# Patient Record
Sex: Male | Born: 1965 | Race: Black or African American | Hispanic: No | Marital: Married | Smoking: Never smoker
Health system: Southern US, Community
[De-identification: ages and names within clinical notes are randomized; demographics above are authoritative.]

## PROBLEM LIST (undated history)

## (undated) DIAGNOSIS — M109 Gout, unspecified: Secondary | ICD-10-CM

## (undated) DIAGNOSIS — I10 Essential (primary) hypertension: Secondary | ICD-10-CM

## (undated) DIAGNOSIS — T8859XA Other complications of anesthesia, initial encounter: Secondary | ICD-10-CM

## (undated) DIAGNOSIS — T4145XA Adverse effect of unspecified anesthetic, initial encounter: Secondary | ICD-10-CM

## (undated) DIAGNOSIS — G4730 Sleep apnea, unspecified: Secondary | ICD-10-CM

## (undated) DIAGNOSIS — I719 Aortic aneurysm of unspecified site, without rupture: Secondary | ICD-10-CM

## (undated) DIAGNOSIS — M1990 Unspecified osteoarthritis, unspecified site: Secondary | ICD-10-CM

## (undated) HISTORY — PX: KNEE ARTHROSCOPY: SHX127

## (undated) HISTORY — PX: EYE SURGERY: SHX253

## (undated) HISTORY — PX: CARPAL TUNNEL RELEASE: SHX101

---

## 2008-02-02 ENCOUNTER — Encounter: Admission: RE | Admit: 2008-02-02 | Discharge: 2008-02-02 | Payer: Self-pay | Admitting: Nephrology

## 2009-01-26 ENCOUNTER — Ambulatory Visit (HOSPITAL_BASED_OUTPATIENT_CLINIC_OR_DEPARTMENT_OTHER): Admission: RE | Admit: 2009-01-26 | Discharge: 2009-01-26 | Payer: Self-pay | Admitting: *Deleted

## 2009-01-29 ENCOUNTER — Ambulatory Visit: Payer: Self-pay | Admitting: Internal Medicine

## 2009-09-05 ENCOUNTER — Encounter: Admission: RE | Admit: 2009-09-05 | Discharge: 2009-09-05 | Payer: Self-pay | Admitting: Cardiology

## 2010-08-23 ENCOUNTER — Inpatient Hospital Stay (INDEPENDENT_AMBULATORY_CARE_PROVIDER_SITE_OTHER)
Admission: RE | Admit: 2010-08-23 | Discharge: 2010-08-23 | Disposition: A | Discharge status: Home / Self Care | Payer: BC Managed Care – PPO | Source: Ambulatory Visit | Attending: Family Medicine | Admitting: Family Medicine

## 2010-08-23 DIAGNOSIS — G5600 Carpal tunnel syndrome, unspecified upper limb: Secondary | ICD-10-CM

## 2010-10-16 ENCOUNTER — Encounter (HOSPITAL_BASED_OUTPATIENT_CLINIC_OR_DEPARTMENT_OTHER)
Admission: RE | Admit: 2010-10-16 | Discharge: 2010-10-16 | Disposition: A | Discharge status: Home / Self Care | Payer: BC Managed Care – PPO | Source: Ambulatory Visit | Attending: Orthopedic Surgery | Admitting: Orthopedic Surgery

## 2010-10-16 DIAGNOSIS — Z01810 Encounter for preprocedural cardiovascular examination: Secondary | ICD-10-CM | POA: Insufficient documentation

## 2010-10-16 DIAGNOSIS — Z01812 Encounter for preprocedural laboratory examination: Secondary | ICD-10-CM | POA: Insufficient documentation

## 2010-10-16 DIAGNOSIS — Z01818 Encounter for other preprocedural examination: Secondary | ICD-10-CM | POA: Insufficient documentation

## 2010-10-16 LAB — BASIC METABOLIC PANEL
BUN: 11 mg/dL (ref 6–23)
CO2: 29 mEq/L (ref 19–32)
Chloride: 107 mEq/L (ref 96–112)
GFR calc Af Amer: >60 mL/min (ref >60)
GFR calc non Af Amer: 56 mL/min — ABNORMAL LOW (ref >60)
Potassium: 4.6 mEq/L (ref 3.5–5.1)
Sodium: 141 mEq/L (ref 135–145)

## 2010-10-19 ENCOUNTER — Ambulatory Visit (HOSPITAL_BASED_OUTPATIENT_CLINIC_OR_DEPARTMENT_OTHER)
Admission: RE | Admit: 2010-10-19 | Discharge: 2010-10-19 | Disposition: A | Discharge status: Home / Self Care | Payer: BC Managed Care – PPO | Source: Ambulatory Visit | Attending: Orthopedic Surgery | Admitting: Orthopedic Surgery

## 2010-10-19 DIAGNOSIS — Z01812 Encounter for preprocedural laboratory examination: Secondary | ICD-10-CM | POA: Insufficient documentation

## 2010-10-19 DIAGNOSIS — I10 Essential (primary) hypertension: Secondary | ICD-10-CM | POA: Insufficient documentation

## 2010-10-19 DIAGNOSIS — G4733 Obstructive sleep apnea (adult) (pediatric): Secondary | ICD-10-CM | POA: Insufficient documentation

## 2010-10-19 DIAGNOSIS — G5600 Carpal tunnel syndrome, unspecified upper limb: Secondary | ICD-10-CM | POA: Insufficient documentation

## 2010-10-19 DIAGNOSIS — Z01810 Encounter for preprocedural cardiovascular examination: Secondary | ICD-10-CM | POA: Insufficient documentation

## 2010-10-24 NOTE — Op Note (Signed)
David [name], [name] NO.:  [id number]  MEDICAL RECORD NO.:  [id number]          PATIENT TYPE:  LOCATION:                                 FACILITY:  PHYSICIAN:  David [name], M.D. DATE OF BIRTH:  11-Nov-1965  DATE OF PROCEDURE:  10/19/2010 DATE OF DISCHARGE:                              OPERATIVE REPORT   PREOPERATIVE DIAGNOSIS:  Moderately severe right carpal tunnel syndrome.  POSTOPERATIVE DIAGNOSIS:  Moderately severe right carpal tunnel syndrome.  OPERATIONS:  Release of right transverse carpal ligament.  OPERATING SURGEON:  David Fitch. Sharvil Hoey, MD  ASSISTANT:  David Reeks Dasnoit, PA  ANESTHESIA:  General anesthesia by LMA, ultimately converted to endotracheal technique due to airway challenge prior to initiation of surgery, supervising anesthesiologist is E. 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  He has a history of bilateral hand numbness, right greater than left. He presented for evaluation at the [facility] and was seen by David [name] who noted evidence of bilateral carpal tunnel syndrome.  He returned to the care of his primary care physician, David [name], and was referred for an upper extremity orthopedic consult with [facility].  Clinical examination revealed signs of bilateral carpal tunnel syndrome. Electrodiagnostic study is completed by David [name], revealed significant bilateral carpal tunnel syndrome.  We advised David [name] to undergo staged bilateral carpal tunnel release.  After informed consent, he was brought to the operating room at this time.  DESCRIPTION OF PROCEDURE:  David [name] was brought to room #1 of [facility] and placed in the supine position on the  operating table.  Following an Anesthesia consult with David [name], general anesthesia by LMA technique was recommended and accepted.  Under Dr. Marlane [name] direct supervision, general anesthesia by LMA technique was induced.  As David [name] arm was being prepped, he had an episode of coughing and had a transient period of laryngeal spasm.  Dr. Jairo [name], attending anesthesiologist, immediately evaluated David [name] and recommended proceeding with succinylcholine paralysis, followed by intubation.  This was easily accomplished with a glide scope.  Thereafter, David [name] was stabilized and the surgical team proceeded with a prep for right carpal tunnel surgery.  The right arm was exsanguinated with Esmarch bandage and the arterial tourniquet on the proximal brachium inflated to 250 mmHg.  A short incision was fashioned in the line of the ring finger in the palm.  The subcutaneous tissues were carefully divided, revealing the palmar fascia.  These were followed back to the median nerve proper which was gently isolated from the transcarpal ligament with the aid of a [place] 4 Engineer, structural.  The transverse carpal ligament was isolated and defined subcutaneously followed by release of the ligament with scissors extending into the distal forearm.  This widely opened the carpal canal.  No mass or predicaments were noted. Bleeding points along the margin of the released ligament were electrocauterized with  bipolar current.  The  wound was then repaired with intradermal 3-0 Prolene suture.  A compressive dressing was applied with a volar plaster splint, maintaining the wrist in 5 degrees of dorsiflexion.  For aftercare, David [name] is provided prescription for Vicodin 7.5 mg 1 p.o. q.4-6 hours p.r.n. pain, 20 tablets without refill.  We will see him back followup in our office in 8 days for dressing change, probable suture removal and advancement to an exercise  program.     David Fitch. David [name], M.D.     RVS/MEDQ  D:  10/19/2010  T:  10/20/2010  Job:  [id number]  cc:   David Slade, MD  Electronically Signed by David [name] M.D. on 10/24/2010 12:03:14 PM

## 2010-11-09 ENCOUNTER — Ambulatory Visit (HOSPITAL_BASED_OUTPATIENT_CLINIC_OR_DEPARTMENT_OTHER)
Admission: RE | Admit: 2010-11-09 | Payer: BC Managed Care – PPO | Source: Ambulatory Visit | Admitting: Orthopedic Surgery

## 2010-11-14 NOTE — Procedures (Signed)
David [name], David [name] NO.:  [id number]   MEDICAL RECORD NO.:  [id number]          PATIENT TYPE:  OUT   LOCATION:  SLEEP CENTER                 FACILITY:  [facility]   PHYSICIAN:  Clinton D. Maple Hudson, MD, FCCP, FACPDATE OF BIRTH:  Jan 13, 1966   DATE OF STUDY:  01/26/2009                            NOCTURNAL POLYSOMNOGRAM   REFERRING PHYSICIAN:  Rosine Abe, M.D.   INDICATION FOR STUDY:  Hypersomnia with sleep apnea.   EPWORTH SLEEPINESS SCORE:  Epworth sleepiness score 4/24, BMI 47.3.  Weight 293 pounds, height 66 inches.  Neck 20 inches.   MEDICATIONS:  Home medications charted and reviewed.   SLEEP ARCHITECTURE:  Total sleep time 205 minutes with sleep efficiency  56.2%.  Stage I was 9.8%, stage II 81%, stage III absent, REM 9.3% of  total sleep time.  Sleep latency 50.5 minutes.  REM latency 99.5  minutes.  Awake after sleep onset 34.5 minutes.  Arousal index 28.4.  No  bedtime medication taken.   RESPIRATORY DATA:  Apnea/hypopnea index (AHI) 6.1 per hour.  A total of  21 events were scored as hypopneas with most events while supine.  REM  AHI 34.7 per hour.  An additional 10 marginal events not meeting  duration and intensity criteria as apneas or hypopneas, were scored as  respiratory effort related arousals for a cumulative respiratory  disturbance index (RDI) of 9.1 per hour.  There were insufficient events  to permit CPAP titration by split protocol on the study night.   OXYGEN DATA:  Loud snoring with oxygen desaturation to a nadir of 86%.  Mean oxygen saturation through the study was 94.2% on room air.   CARDIAC DATA:  Normal sinus rhythm.   MOVEMENT-PARASOMNIA:  No movement disturbance.  Bathroom x1.   IMPRESSIONS-RECOMMENDATIONS:  1. Mild obstructive sleep apnea/hypopnea syndrome, AHI 6.1 per hour,      RDI 9.1 per hour.  Loud snoring with oxygen desaturation to a nadir      of 86% on room air.  2. There were insufficient events to permit CPAP  titration by protocol      on the study night.  If conservative      measures such as weight loss, side sleeping and treatment for nasal      congestion are insufficient, then return for CPAP titration might      be considered, if clinically appropriate.      Clinton D. Maple Hudson, MD, [facility], FACP  Diplomate, Biomedical engineer of Sleep Medicine  Electronically Signed     CDY/MEDQ  D:  01/30/2009 11:01:02  T:  01/30/2009 12:21:43  Job:  [id number]

## 2016-06-06 ENCOUNTER — Ambulatory Visit (INDEPENDENT_AMBULATORY_CARE_PROVIDER_SITE_OTHER): Payer: 59 | Admitting: Orthopaedic Surgery

## 2016-06-06 ENCOUNTER — Encounter (INDEPENDENT_AMBULATORY_CARE_PROVIDER_SITE_OTHER): Payer: Self-pay | Admitting: Orthopaedic Surgery

## 2016-06-06 VITALS — BP 118/82 | HR 81 | Resp 14 | Ht 69.0 in | Wt 296.0 lb

## 2016-06-06 DIAGNOSIS — G8929 Other chronic pain: Secondary | ICD-10-CM

## 2016-06-06 DIAGNOSIS — M25562 Pain in left knee: Secondary | ICD-10-CM | POA: Diagnosis not present

## 2016-06-06 NOTE — Progress Notes (Signed)
   Office Visit Note   Patient: David PippinFrankie E Muradyan           Date of Birth: 04-17-1966           MRN: [id number] Visit Date: 06/06/2016              Requested by: No referring provider defined for this encounter. PCP: No primary care provider on file.   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   Plan: We'll plan on scheduling arthroscopic evaluation of left knee with probable lateral release. He preferred to have this sometime in mid January. We had a long discussion regarding his problem and what might be achieved with an arthroscopic debridement. He is fully aware that this may or may not make a big difference but I think it's the next best step. We've talked about time out of work.  Follow-Up Instructions: No Follow-up on file.   Orders:  No orders of the defined types were placed in this encounter.  No orders of the defined types were placed in this encounter.     Procedures: No procedures performed   Clinical Data: No additional findings.   Subjective: No chief complaint on file.   Multi aspiration, [facility] with cortisone injections last time 2 weeks ago.  Xrays on canopy  Tylenol arthritis, ice , heat  [name] has had problems with his left knee for well over 2 years. He denies any injury or trauma. 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 Been followed by his primary care physician with for recurrent effusion and cortisone injections. He is frustrated because he has recurrent pain and swelling. He's had prior x-rays performed recently demonstrating mild degenerative changes and a small joint effusion of his left knee. An MRI scan was performed in August 2016 demonstrated mild irregularity of the hyalin cartilage along the lateral patella facet and lateral subluxation of the patella both menisci were intact.  Review of Systems   Objective: Vital Signs:  There were no vitals taken for this visit.  Physical Exam  Ortho Exam left knee exam demonstrates minimal effusion. There was lateral patellar pressure with some popping and clicking. I thought the patella was well positioned. There was some patella crepitation with flexion. No evidence of instability. No medial lateral joint pain. Full extension and flexion comparable to the right knee. No swelling distally neurovascular exam was intact. Negative anterior drawer sign  Specialty Comments:  No specialty comments available.  Imaging: No results found.   PMFS History: There are no active problems to display for this patient.  No past medical history on file.  No family history on file.  No past surgical history on file. Social History   Occupational History  . Not on file.   Social History Main Topics  . Smoking status: Not on file  . Smokeless tobacco: Not on file  . Alcohol use Not on file  . Drug use: Unknown  . Sexual activity: Not on file

## 2016-08-01 ENCOUNTER — Inpatient Hospital Stay (INDEPENDENT_AMBULATORY_CARE_PROVIDER_SITE_OTHER): Payer: 59 | Admitting: Orthopaedic Surgery

## 2016-08-09 ENCOUNTER — Encounter: Payer: Self-pay | Admitting: Orthopaedic Surgery

## 2016-08-09 DIAGNOSIS — M222X2 Patellofemoral disorders, left knee: Secondary | ICD-10-CM | POA: Diagnosis not present

## 2016-08-15 ENCOUNTER — Ambulatory Visit (INDEPENDENT_AMBULATORY_CARE_PROVIDER_SITE_OTHER): Payer: 59 | Admitting: Orthopaedic Surgery

## 2016-08-15 DIAGNOSIS — G8929 Other chronic pain: Secondary | ICD-10-CM

## 2016-08-15 DIAGNOSIS — M25562 Pain in left knee: Secondary | ICD-10-CM

## 2016-08-15 NOTE — Progress Notes (Signed)
   Office Visit Note   Patient: David [name]           Date of Birth: 09-03-1965           MRN: [id number] Visit Date: 08/15/2016              Requested by: No referring provider defined for this encounter. PCP: No primary care provider on file.   Assessment & Plan: Visit Diagnoses: [name] is 6 days status post left knee arthroscopy with arthroscopic lateral release and debridement of articular cartilage. He removed his knee immobilizer several days ago and now is unable to extend the knee is not able to extend the knee.  Plan: I attempted an aspiration but there really was nothing in the joint. Like his wife to work on getting his knee straight and then reapply  the knee immobilizer and see him back in a week at that point he may start some physical therapy.  Follow-Up Instructions: No Follow-up on file.   Orders:  No orders of the defined types were placed in this encounter.  No orders of the defined types were placed in this encounter.     Procedures: No procedures performed   Clinical Data: No additional findings.   Subjective: Chief Complaint  Patient presents with  . Left Knee - Routine Post Op    08/09/16    Patient here for post op follow up left knee scope, complains of pain and swelling. States left foot also is swollen.   Patient remove the knee immobilizer several days ago and the dressing. He was instructed on keeping that the the dressing and knee immobilizer placed. Nonetheless he now has a problem extending his knee. He's had some swelling in his lower extremity but denies any calf or thigh pain. Also denies any fever or chills.  Review of Systems   Objective: Vital Signs: There were no vitals taken for this visit.  Physical Exam  Ortho Exam the left knee was warm compared to the right knee. I could not tell if he had a significant effusion as he was as knee was fixed in about 90 of flexion. He did have some pitting edema diffusely on the area  between his knee and his ankle. He has some swelling of his foot. Did not have any posterior thigh or calf pain. The incisions are healing nicely.  I did attempt an aspiration but was unable to retrieve any significant fluid that he also was not significantly tender to touch anywhere about the knee so I don't think that he has a cellulitis or infection.  Specialty Comments:  No specialty comments available.  Imaging: No results found.   PMFS History: There are no active problems to display for this patient.  No past medical history on file.  No family history on file.  No past surgical history on file. Social History   Occupational History  . Not on file.   Social History Main Topics  . Smoking status: Never Smoker  . Smokeless tobacco: Never Used  . Alcohol use No  . Drug use: No  . Sexual activity: Yes

## 2016-08-29 ENCOUNTER — Ambulatory Visit (INDEPENDENT_AMBULATORY_CARE_PROVIDER_SITE_OTHER): Payer: 59 | Admitting: Orthopaedic Surgery

## 2016-08-29 ENCOUNTER — Encounter (INDEPENDENT_AMBULATORY_CARE_PROVIDER_SITE_OTHER): Payer: Self-pay | Admitting: Orthopaedic Surgery

## 2016-08-29 VITALS — BP 140/81 | HR 94 | Resp 14 | Ht 67.0 in | Wt 297.0 lb

## 2016-08-29 DIAGNOSIS — G8929 Other chronic pain: Secondary | ICD-10-CM

## 2016-08-29 DIAGNOSIS — M25562 Pain in left knee: Secondary | ICD-10-CM

## 2016-08-29 NOTE — Progress Notes (Signed)
   Office Visit Note   Patient: David [name]           Date of Birth: 11/02/1965           MRN: [id number] Visit Date: 08/29/2016              Requested by: No referring provider defined for this encounter. PCP: No primary care provider on file.   Assessment & Plan: Visit Diagnoses: 3 weeks status post left knee arthroscopy with debridement of patella and lateral release.   Plan: Continue out of work, continue with physical therapy. Ambulate with crutches until able to extend knee. Office 2 weeks.  Follow-Up Instructions: No Follow-up on file.   Orders:  No orders of the defined types were placed in this encounter.  No orders of the defined types were placed in this encounter.     Procedures: No procedures performed   Clinical Data: No additional findings.   Subjective: No chief complaint on file.    David [name] is 3 weeks status post left knee arthroscopy with arthroscopic lateral release and debridement of articular cartilage. Pt was having so much pain he went to [facility] a weeks ago, no aspiration but did test positive for gout. Pt received an anjection on 08/21/16 by PCP and helped with the pain.Pt is still on crutches/non weight bearing  and is going to PT.  Pt also sent to vascular lab for blood clot-negative   David. David [name] apparently  has had prior history of "gout attacks". This certainly could be related to the onset of pain and loss of motion in his left knee after surgery. Primary care physician injected "some medicine" into his right buttock which "helped". He also had a Doppler study for a DVT which was negative. Review of Systems   Objective: Vital Signs: There were no vitals taken for this visit.  Physical Exam  Ortho Exam left knee exam with minimal effusion. No local tenderness along the medial lateral joint or even along the lateral parapatellar region. Small effusion. No calf pain. No swelling distally. Lacks approximately 50 to knee extension  which is certainly an improvement with physical therapy.. Able to flex over 100.  Specialty Comments:  No specialty comments available.  Imaging: No results found.   PMFS History: There are no active problems to display for this patient.  No past medical history on file.  No family history on file.  No past surgical history on file. Social History   Occupational History  . Not on file.   Social History Main Topics  . Smoking status: Never Smoker  . Smokeless tobacco: Never Used  . Alcohol use No  . Drug use: No  . Sexual activity: Yes

## 2016-09-12 ENCOUNTER — Encounter (INDEPENDENT_AMBULATORY_CARE_PROVIDER_SITE_OTHER): Payer: Self-pay | Admitting: Orthopaedic Surgery

## 2016-09-12 ENCOUNTER — Ambulatory Visit (INDEPENDENT_AMBULATORY_CARE_PROVIDER_SITE_OTHER): Payer: 59 | Admitting: Orthopaedic Surgery

## 2016-09-12 VITALS — BP 180/75 | HR 85 | Ht 64.0 in | Wt 297.0 lb

## 2016-09-12 DIAGNOSIS — G8929 Other chronic pain: Secondary | ICD-10-CM | POA: Diagnosis not present

## 2016-09-12 DIAGNOSIS — M25562 Pain in left knee: Secondary | ICD-10-CM

## 2016-09-12 MED ORDER — BUPIVACAINE HCL 0.5 % IJ SOLN
3.0000 mL | INTRAMUSCULAR | Status: AC | PRN
  Administered 2016-09-12: 3 mL via INTRA_ARTICULAR

## 2016-09-12 MED ORDER — LIDOCAINE HCL 1 % IJ SOLN
5.0000 mL | INTRAMUSCULAR | Status: AC | PRN
  Administered 2016-09-12: 5 mL

## 2016-09-12 MED ORDER — METHYLPREDNISOLONE ACETATE 40 MG/ML IJ SUSP
80.0000 mg | INTRAMUSCULAR | Status: AC | PRN
  Administered 2016-09-12: 80 mg

## 2016-09-12 NOTE — Progress Notes (Signed)
   Office Visit Note   Patient: David [name]           Date of Birth: 05/06/1966           MRN: [id number] Visit Date: 09/12/2016              Requested by: No referring provider defined for this encounter. PCP: No PCP Per Patient   Assessment & Plan: Visit Diagnoses: 5 weeks s/p left knee arthroscopy with lateral release-still unable to extend knee from 90 degrees of flexion  Plan: cortisone inj,manipulation in 1 week if no improvement  Follow-Up Instructions: No Follow-up on file.   Orders:  No orders of the defined types were placed in this encounter.  No orders of the defined types were placed in this encounter.     Procedures: Large Joint Inj Date/Time: 09/12/2016 2:01 PM Performed by: Valeria BatmanWHITFIELD, PETER W Authorized by: Valeria BatmanWHITFIELD, PETER W   Consent Given by:  Patient Timeout: prior to procedure the correct patient, procedure, and site was verified   Indications:  Pain and joint swelling Location:  Knee Site:  L knee Prep: patient was prepped and draped in usual sterile fashion   Needle Size:  25 G Needle Length:  1.5 inches Approach:  Anteromedial Ultrasound Guidance: No   Fluoroscopic Guidance: No   Arthrogram: No   Medications:  5 mL lidocaine 1 %; 80 mg methylPREDNISolone acetate 40 MG/ML; 3 mL bupivacaine 0.5 % Aspiration Attempted: No   Patient tolerance:  Patient tolerated the procedure well with no immediate complications     Clinical Data: No additional findings.   Subjective: No chief complaint on file.   Mr. David [name] is status post 5 weeks left knee meniscus surgery. Edema and pain are constant. Pt is very upset that he still cannot walk without crutches and he has to go back to work.. PT only 2 times as he relates when they twist and turn it makes his knee hurt more and keep swelling. 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  Despite PT still unable to extend left knee beyond 90 flexion  Review of  Systems   Objective: Vital Signs: There were no vitals taken for this visit.  Physical Exam  Ortho Examminimal pain about left knee with minimal effusion. No calf pain. Cannot extend knee from 90 degrees. No evidence of infection or DVT  Specialty Comments:  No specialty comments available.  Imaging: No results found.   PMFS History: There are no active problems to display for this patient.  No past medical history on file.  No family history on file.  No past surgical history on file. Social History   Occupational History  . Not on file.   Social History Main Topics  . Smoking status: Never Smoker  . Smokeless tobacco: Never Used  . Alcohol use No  . Drug use: No  . Sexual activity: Yes

## 2016-09-17 NOTE — Progress Notes (Signed)
Left message with instructions for patient to arrive at 0950. NPO past midnight including gum and candy, will need driver, and cannot stay alone for 24 past anesthesia.

## 2016-09-17 NOTE — H&P (Signed)
    David CampbellPeter Whitfield, MD   David CodeBrian Oreatha Fabry, PA-C [address], [place], [place]  [id number]                             [phone]   ORTHOPAEDIC HISTORY & PHYSICAL  David PippinFrankie E [name] MRN:  [id number] DOB/SEX:  1966-05-29/male  CHIEF COMPLAINT:  Loss of motion left knee   HISTORY:David [name] is status post 5 weeks left knee meniscus surgery. Edema and pain are constant. Pt is very upset that he still cannot walk without crutches and he has to go back to work.. PT only 2 times as he relates when they twist and turn it makes his knee hurt more and keep swelling. 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 Despite PT still unable to extend left knee beyond 90 flexion   PAST MEDICAL HISTORY: There are no active problems to display for this patient.  No past medical history on file. No past surgical history on file.   MEDICATIONS:   Prescriptions Prior to Admission  Medication Sig Dispense Refill Last Dose  . aspirin EC 81 MG tablet Take 81 mg by mouth daily.      . chlorthalidone (HYGROTON) 25 MG tablet Take 25 mg by mouth daily.      Marland Kitchen. lisinopril (PRINIVIL,ZESTRIL) 40 MG tablet Take 40 mg by mouth daily.        ALLERGIES:   Allergies  Allergen Reactions  . Other Anaphylaxis    All Beans    FAMILY HISTORY:  No family history on file.  SOCIAL HISTORY:   Social History  Substance Use Topics  . Smoking status: Never Smoker  . Smokeless tobacco: Never Used  . Alcohol use No      EXAMINATION: Vital signs in last 24 hours: Temp:  [98.9 F (37.2 C)] 98.9 F (37.2 C) (03/20 0936) Pulse Rate:  [72] 72 (03/20 0936) Resp:  [16] 16 (03/20 0936) BP: (117)/(78) 117/78 (03/20 0936) SpO2:  [99 %] 99 % (03/20 0936) Weight:  [297 lb (134.7 kg)] 297 lb (134.7 kg) (03/20 0936)  Head is normocephalic.   Eyes:  Pupils equal, round and reactive to light and accommodation.  Extraocular intact. ENT: Ears, nose, and throat were benign.   Neck: supple, no  bruits were noted.   Chest: good expansion.   Lungs: essentially clear.   Cardiac: regular rhythm and rate, normal S1, S2.  No murmurs appreciated. Pulses :  2+ bilateral and symmetric in lower extremities. Abdomen is scaphoid, soft, nontender, no masses palpable, normal bowel sounds present. CNS:  He is oriented x3 and cranial nerves II-XII grossly intact.minimal pain about left knee with minimal effusion. No calf pain. Cannot extend knee from 90 degrees. No evidence of infection or DVT Breast, rectal, and genital exams: not performed and not indicated for an orthopedic evaluation. Musculoskeletal: unable to extend knee past 90 degrees of flexion.    ASSESSMENT: left knee post op arthrofibrosis  No past medical history on file.  PLAN: Plan for left knee manipulation  The procedure,  risks, and benefits of surgery were presented and reviewed including possible recurrence of arthrofibrosis. The patient acknowledged the explanation, agreed to proceed.   David DroneBrian D. Santiago Bumpersetrarca, PA-C David [name] [phone]  David DroneBrian D. Aleda Granaetrarca, PA-C David [name] [phone]  09/18/2016 9:50 AM

## 2016-09-18 ENCOUNTER — Ambulatory Visit (HOSPITAL_COMMUNITY): Payer: 59 | Admitting: Certified Registered Nurse Anesthetist

## 2016-09-18 ENCOUNTER — Encounter (HOSPITAL_COMMUNITY)
Admission: RE | Disposition: A | Discharge status: Home / Self Care | Payer: Self-pay | Source: Ambulatory Visit | Attending: Orthopaedic Surgery

## 2016-09-18 ENCOUNTER — Encounter (HOSPITAL_COMMUNITY): Payer: Self-pay | Admitting: *Deleted

## 2016-09-18 ENCOUNTER — Ambulatory Visit (HOSPITAL_COMMUNITY)
Admission: RE | Admit: 2016-09-18 | Discharge: 2016-09-18 | Disposition: A | Discharge status: Home / Self Care | Payer: 59 | Source: Ambulatory Visit | Attending: Orthopaedic Surgery | Admitting: Orthopaedic Surgery

## 2016-09-18 DIAGNOSIS — M24662 Ankylosis, left knee: Secondary | ICD-10-CM | POA: Diagnosis not present

## 2016-09-18 DIAGNOSIS — Y838 Other surgical procedures as the cause of abnormal reaction of the patient, or of later complication, without mention of misadventure at the time of the procedure: Secondary | ICD-10-CM | POA: Insufficient documentation

## 2016-09-18 DIAGNOSIS — Z91018 Allergy to other foods: Secondary | ICD-10-CM | POA: Insufficient documentation

## 2016-09-18 DIAGNOSIS — Z7982 Long term (current) use of aspirin: Secondary | ICD-10-CM | POA: Diagnosis not present

## 2016-09-18 DIAGNOSIS — I10 Essential (primary) hypertension: Secondary | ICD-10-CM | POA: Diagnosis not present

## 2016-09-18 DIAGNOSIS — M9689 Other intraoperative and postprocedural complications and disorders of the musculoskeletal system: Secondary | ICD-10-CM | POA: Insufficient documentation

## 2016-09-18 DIAGNOSIS — Z79899 Other long term (current) drug therapy: Secondary | ICD-10-CM | POA: Insufficient documentation

## 2016-09-18 DIAGNOSIS — Z6841 Body Mass Index (BMI) 40.0 and over, adult: Secondary | ICD-10-CM | POA: Insufficient documentation

## 2016-09-18 DIAGNOSIS — M76892 Other specified enthesopathies of left lower limb, excluding foot: Secondary | ICD-10-CM | POA: Diagnosis not present

## 2016-09-18 DIAGNOSIS — M7500 Adhesive capsulitis of unspecified shoulder: Secondary | ICD-10-CM | POA: Diagnosis present

## 2016-09-18 HISTORY — DX: Essential (primary) hypertension: I10

## 2016-09-18 HISTORY — DX: Sleep apnea, unspecified: G47.30

## 2016-09-18 HISTORY — DX: Aortic aneurysm of unspecified site, without rupture: I71.9

## 2016-09-18 HISTORY — DX: Adverse effect of unspecified anesthetic, initial encounter: T41.45XA

## 2016-09-18 HISTORY — DX: Other complications of anesthesia, initial encounter: T88.59XA

## 2016-09-18 HISTORY — PX: KNEE CLOSED REDUCTION: SHX995

## 2016-09-18 LAB — POCT I-STAT 4, (NA,K, GLUC, HGB,HCT)
Glucose, Bld: 101 mg/dL — ABNORMAL HIGH (ref 65–99)
HCT: 40 % (ref 39.0–52.0)
Hemoglobin: 13.6 g/dL (ref 13.0–17.0)
POTASSIUM: 3.5 mmol/L (ref 3.5–5.1)
SODIUM: 142 mmol/L (ref 135–145)

## 2016-09-18 SURGERY — MANIPULATION, KNEE, CLOSED
Anesthesia: General | Site: Knee | Laterality: Left

## 2016-09-18 MED ORDER — OXYCODONE-ACETAMINOPHEN 5-325 MG PO TABS
ORAL_TABLET | ORAL | Status: AC
  Administered 2016-09-18: 2 via ORAL
  Filled 2016-09-18: qty 2

## 2016-09-18 MED ORDER — METHOCARBAMOL 500 MG PO TABS
500.0000 mg | ORAL_TABLET | Freq: Once | ORAL | Status: AC
  Administered 2016-09-18: 500 mg via ORAL

## 2016-09-18 MED ORDER — METHOCARBAMOL 500 MG PO TABS: 500.0000 mg | ORAL_TABLET | Freq: Four times a day (QID) | ORAL | 0 refills | Status: DC | PRN

## 2016-09-18 MED ORDER — HYDROMORPHONE HCL 1 MG/ML IJ SOLN
INTRAMUSCULAR | Status: AC
  Administered 2016-09-18: 0.5 mg via INTRAVENOUS
  Filled 2016-09-18: qty 0.5

## 2016-09-18 MED ORDER — SUGAMMADEX SODIUM 200 MG/2ML IV SOLN
INTRAVENOUS | Status: AC
  Filled 2016-09-18: qty 2

## 2016-09-18 MED ORDER — SODIUM CHLORIDE 0.9 % IV SOLN: INTRAVENOUS | Status: DC

## 2016-09-18 MED ORDER — DEXAMETHASONE SODIUM PHOSPHATE 10 MG/ML IJ SOLN
INTRAMUSCULAR | Status: AC
  Filled 2016-09-18: qty 1

## 2016-09-18 MED ORDER — FENTANYL CITRATE (PF) 100 MCG/2ML IJ SOLN
INTRAMUSCULAR | Status: AC
  Filled 2016-09-18: qty 2

## 2016-09-18 MED ORDER — PROPOFOL 10 MG/ML IV BOLUS
INTRAVENOUS | Status: AC
  Filled 2016-09-18: qty 40

## 2016-09-18 MED ORDER — EPHEDRINE 5 MG/ML INJ
INTRAVENOUS | Status: AC
  Filled 2016-09-18: qty 10

## 2016-09-18 MED ORDER — LACTATED RINGERS IV SOLN
INTRAVENOUS | Status: DC | PRN
  Administered 2016-09-18: 10:00:00 via INTRAVENOUS

## 2016-09-18 MED ORDER — LIDOCAINE 2% (20 MG/ML) 5 ML SYRINGE
INTRAMUSCULAR | Status: DC | PRN
  Administered 2016-09-18: 60 mg via INTRAVENOUS

## 2016-09-18 MED ORDER — PHENYLEPHRINE 40 MCG/ML (10ML) SYRINGE FOR IV PUSH (FOR BLOOD PRESSURE SUPPORT)
PREFILLED_SYRINGE | INTRAVENOUS | Status: AC
  Filled 2016-09-18: qty 10

## 2016-09-18 MED ORDER — METHOCARBAMOL 500 MG PO TABS
ORAL_TABLET | ORAL | Status: AC
  Administered 2016-09-18: 500 mg via ORAL
  Filled 2016-09-18: qty 1

## 2016-09-18 MED ORDER — PROPOFOL 10 MG/ML IV BOLUS
INTRAVENOUS | Status: DC | PRN
  Administered 2016-09-18: 200 mg via INTRAVENOUS

## 2016-09-18 MED ORDER — OXYCODONE-ACETAMINOPHEN 5-325 MG PO TABS: 1.0000 | ORAL_TABLET | ORAL | 0 refills | Status: DC | PRN

## 2016-09-18 MED ORDER — LIDOCAINE 2% (20 MG/ML) 5 ML SYRINGE
INTRAMUSCULAR | Status: AC
  Filled 2016-09-18: qty 5

## 2016-09-18 MED ORDER — MIDAZOLAM HCL 2 MG/2ML IJ SOLN
INTRAMUSCULAR | Status: AC
  Filled 2016-09-18: qty 2

## 2016-09-18 MED ORDER — SUCCINYLCHOLINE CHLORIDE 200 MG/10ML IV SOSY
PREFILLED_SYRINGE | INTRAVENOUS | Status: AC
  Filled 2016-09-18: qty 10

## 2016-09-18 MED ORDER — LIDOCAINE-EPINEPHRINE (PF) 1 %-1:200000 IJ SOLN
INTRAMUSCULAR | Status: DC | PRN
  Administered 2016-09-18: 10 mL

## 2016-09-18 MED ORDER — ACETAMINOPHEN 10 MG/ML IV SOLN
1000.0000 mg | Freq: Once | INTRAVENOUS | Status: AC
  Administered 2016-09-18: 1000 mg via INTRAVENOUS
  Filled 2016-09-18: qty 100

## 2016-09-18 MED ORDER — OXYCODONE-ACETAMINOPHEN 5-325 MG PO TABS
2.0000 | ORAL_TABLET | Freq: Once | ORAL | Status: AC
  Administered 2016-09-18: 2 via ORAL

## 2016-09-18 MED ORDER — HYDROMORPHONE HCL 1 MG/ML IJ SOLN
0.2500 mg | INTRAMUSCULAR | Status: DC | PRN
  Administered 2016-09-18: 0.5 mg via INTRAVENOUS

## 2016-09-18 MED ORDER — ONDANSETRON HCL 4 MG/2ML IJ SOLN
INTRAMUSCULAR | Status: AC
  Filled 2016-09-18: qty 2

## 2016-09-18 MED ORDER — PROMETHAZINE HCL 25 MG/ML IJ SOLN: 6.2500 mg | INTRAMUSCULAR | Status: DC | PRN

## 2016-09-18 MED ORDER — LIDOCAINE-EPINEPHRINE (PF) 1 %-1:200000 IJ SOLN
INTRAMUSCULAR | Status: AC
  Filled 2016-09-18: qty 30

## 2016-09-18 MED ORDER — MIDAZOLAM HCL 2 MG/2ML IJ SOLN
INTRAMUSCULAR | Status: DC | PRN
  Administered 2016-09-18: 2 mg via INTRAVENOUS

## 2016-09-18 MED ORDER — BUPIVACAINE-EPINEPHRINE (PF) 0.5% -1:200000 IJ SOLN
INTRAMUSCULAR | Status: DC | PRN
  Administered 2016-09-18: 30 mL via PERINEURAL

## 2016-09-18 MED ORDER — FENTANYL CITRATE (PF) 100 MCG/2ML IJ SOLN
INTRAMUSCULAR | Status: DC | PRN
  Administered 2016-09-18: 100 ug via INTRAVENOUS

## 2016-09-18 MED ORDER — ROCURONIUM BROMIDE 50 MG/5ML IV SOSY
PREFILLED_SYRINGE | INTRAVENOUS | Status: AC
  Filled 2016-09-18: qty 5

## 2016-09-18 SURGICAL SUPPLY — 7 items
GAUZE SPONGE 4X4 12PLY STRL (GAUZE/BANDAGES/DRESSINGS) ×2 IMPLANT
IMMOBILIZER KNEE 24 THIGH 36 (MISCELLANEOUS) ×1 IMPLANT
IMMOBILIZER KNEE 24 UNIV (MISCELLANEOUS) ×2
NEEDLE 18GX1X1/2 (RX/OR ONLY) (NEEDLE) ×2 IMPLANT
NEEDLE 22X1 1/2 (OR ONLY) (NEEDLE) ×2 IMPLANT
SOLUTION BETADINE 4OZ (MISCELLANEOUS) ×2 IMPLANT
SYR CONTROL 10ML LL (SYRINGE) ×2 IMPLANT

## 2016-09-18 NOTE — Transfer of Care (Signed)
Immediate Anesthesia Transfer of Care Note  Patient: David PippinFrankie E [name]  Procedure(s) Performed: Procedure(s): LEFT CLOSED MANIPULATION KNEE (Left)  Patient Location: PACU  Anesthesia Type:General  Level of Consciousness: awake, alert  and oriented  Airway & Oxygen Therapy: Patient Spontanous Breathing  Post-op Assessment: Report given to RN, Post -op Vital signs reviewed and stable and Patient moving all extremities  Post vital signs: Reviewed and stable  Last Vitals:  Vitals:   09/18/16 1047 09/18/16 1049  BP: 126/78   Pulse: 82 80  Resp: 20 18  Temp:      Last Pain:  Vitals:   09/18/16 1049  TempSrc:   PainSc: 6       Patients Stated Pain Goal: 3 (09/18/16 1000)  Complications: No apparent anesthesia complications

## 2016-09-18 NOTE — Anesthesia Postprocedure Evaluation (Addendum)
Anesthesia Post Note  Patient: David [name]  Procedure(s) Performed: Procedure(s) (LRB): LEFT CLOSED MANIPULATION KNEE (Left)  Patient location during evaluation: PACU Anesthesia Type: General Level of consciousness: sedated Pain management: pain level controlled Vital Signs Assessment: post-procedure vital signs reviewed and stable Respiratory status: spontaneous breathing and respiratory function stable Cardiovascular status: stable Anesthetic complications: no       Last Vitals:  Vitals:   09/18/16 1119 09/18/16 1131  BP: 112/76 114/90  Pulse: 72 79  Resp: 18 18  Temp: 36.7 C     Last Pain:  Vitals:   09/18/16 1131  TempSrc:   PainSc: 3                  Truda Staub DANIEL

## 2016-09-18 NOTE — Op Note (Signed)
PATIENT ID:      David PippinFrankie E [name]  MRN:     [id number] DOB/AGE:    Mar 25, 1966 / 51 y.o.       OPERATIVE REPORT    DATE OF PROCEDURE:  09/18/2016       PREOPERATIVE DIAGNOSIS:   LEFT KNEE ADHESIVE CAPSULITIS                                                       Estimated body mass index is 50.98 kg/m as calculated from the following:   Height as of 09/12/16: 5\' 4"  (1.626 m).   Weight as of this encounter: 297 lb (134.7 kg).     POSTOPERATIVE DIAGNOSIS:   LEFT KNEE ADHESIVE CAPSULITIS                                                                     Estimated body mass index is 50.98 kg/m as calculated from the following:   Height as of 09/12/16: 5\' 4"  (1.626 m).   Weight as of this encounter: 297 lb (134.7 kg).     PROCEDURE:  Procedure(s): LEFT CLOSED MANIPULATION KNEE      SURGEON:  Norlene CampbellPeter Deanette Tullius, MD    ASSISTANT:   Jacqualine CodeBrian Petrarca, PA-C   (Present and scrubbed throughout the case, critical for assistance with exposure, retraction, instrumentation, and closure.)          ANESTHESIA: regional and general     DRAINS: none :      TOURNIQUET TIME: * No tourniquets in log *    COMPLICATIONS:  None   CONDITION:  stable  PROCEDURE IN [id number]  David MangesPeter W Aldrich Lloyd 09/18/2016, 10:39 AM

## 2016-09-18 NOTE — Addendum Note (Signed)
Addendum  created 09/18/16 1242 by Jodell CiproSarah A Chelby Salata, CRNA   Charge Capture section accepted

## 2016-09-18 NOTE — Op Note (Signed)
NAME:  David [name], David [name]                   ACCOUNT NO.:  MEDICAL RECORD NO.:  [id number]  LOCATION:                                 FACILITY:  PHYSICIAN:  Peter W. Whitfield, M.D.    DATE OF BIRTH:  DATE OF PROCEDURE:  09/18/2016 DATE OF DISCHARGE:                              OPERATIVE REPORT   PREOPERATIVE DIAGNOSIS:  Adhesive capsulitis, left knee.  POSTOPERATIVE DIAGNOSIS:  Adhesive capsulitis, left knee.  PROCEDURE:  Closed manipulation, left knee.  SURGEON:  Peter W. Whitfield, M.D.  ASSISTANT:  Brian D. Petrarca, P.A.-C.  ANESTHESIA:  General with adductor canal block.  COMPLICATIONS:  None.  HISTORY:  This 50-year-old gentleman is approximately 6 weeks status post left knee arthroscopy with a lateral release.  He has developed a fixed flexion contracture of his knee despite physical therapy.  He has had a recent cortisone injection that allowed him to flex, to extend within 45 degrees of full extension, but continues to have difficulty with ambulation and pain.  He is now to have a closed manipulation of his knee.  DESCRIPTION OF PROCEDURE:  [name] was met with his wife in the holding area, identified the left knee as appropriate operative site and marked it accordingly.  Anesthesia performed an adductor canal block. The patient was then transported to room #3, and on the operating stretcher, he was placed under general laryngal anesthesia.  At that point, the knee was gently manipulated in both flexion and extension. There was obvious lysis of adhesions to the point where we could flex his knee over 125 degrees and extended to neutral.  We then injected 8 mL of 1% Xylocaine with epinephrine.  Prepped the knee with alcohol and Betadine, injected the knee without difficulty.  A knee immobilizer was applied.  The patient was then returned to the postanesthesia recovery room without complications.     Peter W. Whitfield, M.D.    PWW/MEDQ  D:  09/18/2016   T:  09/18/2016  Job:  [id number] 

## 2016-09-18 NOTE — H&P (Signed)
The recent History & Physical has been reviewed. I have personally examined the patient today. There is no interval change to the documented History & Physical. The patient would like to proceed with the procedure.  Valeria Batmaneter W Kerron Sedano 09/18/2016,  9:39 AM

## 2016-09-18 NOTE — Anesthesia Preprocedure Evaluation (Addendum)
Anesthesia Evaluation  Patient identified by MRN, date of birth, ID band Patient awake    Reviewed: Allergy & Precautions, NPO status , Patient's Chart, lab work & pertinent test results  History of Anesthesia Complications Negative for: history of anesthetic complications  Airway Mallampati: III  TM Distance: >3 FB Neck ROM: Full    Dental no notable dental hx. (+) Dental Advisory Given   Pulmonary neg pulmonary ROS,    Pulmonary exam normal        Cardiovascular hypertension, Pt. on medications Normal cardiovascular exam     Neuro/Psych negative neurological ROS  negative psych ROS   GI/Hepatic negative GI ROS, Neg liver ROS,   Endo/Other  Morbid obesity  Renal/GU negative Renal ROS     Musculoskeletal   Abdominal   Peds  Hematology   Anesthesia Other Findings   Reproductive/Obstetrics                            Anesthesia Physical Anesthesia Plan  ASA: III  Anesthesia Plan: General   Post-op Pain Management: GA combined w/ Regional for post-op pain   Induction: Intravenous  Airway Management Planned: Oral ETT and LMA  Additional Equipment:   Intra-op Plan:   Post-operative Plan: Extubation in OR  Informed Consent: I have reviewed the patients History and Physical, chart, labs and discussed the procedure including the risks, benefits and alternatives for the proposed anesthesia with the patient or authorized representative who has indicated his/her understanding and acceptance.   Dental advisory given  Plan Discussed with: CRNA, Anesthesiologist and Surgeon  Anesthesia Plan Comments:       Anesthesia Quick Evaluation

## 2016-09-18 NOTE — Anesthesia Procedure Notes (Signed)
Anesthesia Regional Block: Adductor canal block   Pre-Anesthetic Checklist: ,, timeout performed, Correct Patient, Correct Site, Correct Laterality, Correct Procedure, Correct Position, site marked, Risks and benefits discussed,  Surgical consent,  Pre-op evaluation,  At surgeon's request and post-op pain management  Laterality: Left  Prep: chloraprep       Needles:  Injection technique: Single-shot  Needle Type: Stimulator Needle - 80     Needle Length: 10cm  Needle Gauge: 21     Additional Needles:   Procedures: ultrasound guided,,,,,,,,  Narrative:  Start time: 09/18/2016 10:07 AM End time: 09/18/2016 10:17 AM Injection made incrementally with aspirations every 5 mL.  Performed by: Personally

## 2016-09-18 NOTE — Anesthesia Procedure Notes (Signed)
Procedure Name: LMA Insertion Date/Time: 09/18/2016 10:31 AM Performed by: Orvilla FusATO, Annessa Satre A Pre-anesthesia Checklist: Patient identified, Emergency Drugs available, Suction available and Patient being monitored Patient Re-evaluated:Patient Re-evaluated prior to inductionOxygen Delivery Method: Circle System Utilized Preoxygenation: Pre-oxygenation with 100% oxygen Intubation Type: IV induction LMA: LMA inserted LMA Size: 5.0 Number of attempts: 1 Placement Confirmation: positive ETCO2 Tube secured with: Tape Dental Injury: Teeth and Oropharynx as per pre-operative assessment

## 2016-09-19 ENCOUNTER — Encounter (HOSPITAL_COMMUNITY): Payer: Self-pay | Admitting: Orthopaedic Surgery

## 2016-09-26 ENCOUNTER — Ambulatory Visit (INDEPENDENT_AMBULATORY_CARE_PROVIDER_SITE_OTHER): Payer: 59

## 2016-09-26 ENCOUNTER — Ambulatory Visit (INDEPENDENT_AMBULATORY_CARE_PROVIDER_SITE_OTHER): Payer: 59 | Admitting: Orthopaedic Surgery

## 2016-09-26 VITALS — Ht 60.0 in

## 2016-09-26 DIAGNOSIS — M25562 Pain in left knee: Secondary | ICD-10-CM | POA: Diagnosis not present

## 2016-09-26 DIAGNOSIS — G8929 Other chronic pain: Secondary | ICD-10-CM | POA: Diagnosis not present

## 2016-09-26 MED ORDER — LIDOCAINE HCL 1 % IJ SOLN
5.0000 mL | INTRAMUSCULAR | Status: AC | PRN
  Administered 2016-09-26: 5 mL

## 2016-09-26 MED ORDER — BUPIVACAINE HCL 0.5 % IJ SOLN
3.0000 mL | INTRAMUSCULAR | Status: AC | PRN
  Administered 2016-09-26: 3 mL via INTRA_ARTICULAR

## 2016-09-26 MED ORDER — METHYLPREDNISOLONE ACETATE 40 MG/ML IJ SUSP
80.0000 mg | INTRAMUSCULAR | Status: AC | PRN
  Administered 2016-09-26: 80 mg

## 2016-09-26 NOTE — Progress Notes (Signed)
Office Visit Note   Patient: David [name]           Date of Birth: 03/01/1966           MRN: [id number] Visit Date: 09/26/2016              Requested by: No referring provider defined for this encounter. PCP: David [name]   Assessment & Plan: Visit Diagnoses: adhesive capsulitis left knee s/p arthroscopy-one week s/p manipulation. Still having some discomfort and inability to fully extend the knee Plan: I aspirated of the left knee of about 6 mL of somewhat bloody fluid I will. I will send this for cell cell count crystals. He does have history of gout. Also injected's 5 mL of 1% Xylocaine without epinephrine and 80 mg of Depo-Medrol. I'll set up physical therapy and obtain aJahss splint.to Help regain extension of the left knee. Continue out of work. office 1 week.  Follow-Up Instructions: No Follow-up on file.   Orders:  Orders Placed This Encounter  Procedures  . XR Pelvis 1-2 Views   No orders of the defined types were placed in this encounter.     Procedures: Large Joint Inj Date/Time: 09/26/2016 11:27 AM Performed by: David BatmanWHITFIELD, David [name] W Authorized by: David BatmanWHITFIELD, David [name] W   Consent Given by:  Patient Timeout: prior to procedure the correct patient, procedure, and site was verified   Indications:  Pain and joint swelling Location:  Knee Site:  L knee Prep: patient was prepped and draped in usual sterile fashion   Needle Size:  25 G Needle Length:  1.5 inches Approach:  Anteromedial Ultrasound Guidance: No   Fluoroscopic Guidance: No   Arthrogram: No   Medications:  5 mL lidocaine 1 %; 80 mg methylPREDNISolone acetate 40 MG/ML; 3 mL bupivacaine 0.5 % Aspiration Attempted: No   Patient tolerance:  Patient tolerated the procedure well with no immediate complications     Clinical Data: No additional findings.   Subjective: Chief Complaint  Patient presents with  . Left Knee - Routine Post Op    Mr. David [name] is a 51 year old male status post 6 days  sugery. He ambulate son crutches.  [name] developed adhesive capsulitis left knee after knee arthroscopy and lateral release. He is one week s/p closed manipulation with a knee immobilizer.Using crutches and still having some pain. No fever or chills. Having some left hip pain  Review of Systems   Objective: Vital Signs: Ht 5' (1.524 m)   Physical Exam  Ortho Exam left knee was perfectly straight after manipulation last week. A knee immobilizer was applied. The knee immobilizer is barely covering his knee and now in a  distal position. His knee is flexed about 30 and I'm having difficulty extending it there is a small effusion but the knee wasn't particularly warm hot or uncomfortable. He seems to have some pain with range of motion of his left hip.Pain localized about the left knee. No instability. The knee was not hot warm or red. Has developed some early quad atrophy.  Specialty Comments:  No specialty comments available.  Imaging: Xr Pelvis 1-2 Views  Result Date: 09/26/2016 X-ray of the pelvis is obtained and 1 AP projection. There are degenerative changes about both hips with peripheral osteophytes, calcification within the capsule and mild decrease in the joint space. No evidence of a fracture or other bony abnormality.    PMFS History: Patient Active Problem List   Diagnosis Date Noted  . Arthrofibrosis of  knee joint, left 09/18/2016   Past Medical History:  Diagnosis Date  . Aneurysm of aorta (HCC)   . Complication of anesthesia    hard to wake up  . Hypertension   . Sleep apnea     No family history on file.  Past Surgical History:  Procedure Laterality Date  . CARPAL TUNNEL RELEASE Right   . EYE SURGERY     d/t cornea laceration  . KNEE ARTHROSCOPY Left   . KNEE CLOSED REDUCTION Left 09/18/2016   Procedure: LEFT CLOSED MANIPULATION KNEE;  Surgeon: David Batman, [name];  Location: [facility] OR;  Service: Orthopedics;  Laterality: Left;   Social History    Occupational History  . Not on file.   Social History Main Topics  . Smoking status: Never Smoker  . Smokeless tobacco: Never Used  . Alcohol use No  . Drug use: No  . Sexual activity: Yes

## 2016-09-27 ENCOUNTER — Telehealth (INDEPENDENT_AMBULATORY_CARE_PROVIDER_SITE_OTHER): Payer: Self-pay | Admitting: Orthopaedic Surgery

## 2016-09-27 NOTE — Telephone Encounter (Signed)
[employer] rep calling in ref to the L knee.  Form was sent and by fax yesterday. Patient is need immediately, but notes and medical necessity form needs completion.  This needs to be faxed back today.  Patient is scheduled to be fitted tomorrow, but will not be able to without info.

## 2016-09-27 NOTE — Telephone Encounter (Signed)
Left VM for rep to send me the fax number as the [facility] order was [facility] yesterday

## 2016-10-03 ENCOUNTER — Encounter (INDEPENDENT_AMBULATORY_CARE_PROVIDER_SITE_OTHER): Payer: Self-pay | Admitting: Orthopaedic Surgery

## 2016-10-03 ENCOUNTER — Ambulatory Visit (INDEPENDENT_AMBULATORY_CARE_PROVIDER_SITE_OTHER): Payer: 59 | Admitting: Orthopaedic Surgery

## 2016-10-03 VITALS — BP 125/71 | HR 75 | Resp 16 | Ht 70.0 in | Wt 297.0 lb

## 2016-10-03 DIAGNOSIS — G8929 Other chronic pain: Secondary | ICD-10-CM

## 2016-10-03 DIAGNOSIS — M25562 Pain in left knee: Secondary | ICD-10-CM

## 2016-10-03 NOTE — Progress Notes (Signed)
   Office Visit Note   Patient: David [name]           Date of Birth: 1966/01/28           MRN: [id number] Visit Date: 10/03/2016              Requested by: Estanislado Pandy, MD [address] [place], [place] [address] PCP: Estanislado Pandy, MD   Assessment & Plan: Visit Diagnoses:  1. Chronic pain of left knee   Status post left knee arthroscopy and lateral release development of adhesive capsulitis. Status post knee manipulation time several weeks. Range of motion, swelling and pain improved  Plan: continue with PT and Jahss splint left knee. No work  Follow-Up Instructions: Return in about 2 weeks (around 10/17/2016).   Orders:  No orders of the defined types were placed in this encounter.  No orders of the defined types were placed in this encounter.     Procedures: No procedures performed   Clinical Data: No additional findings.   Subjective: Chief Complaint  Patient presents with  . Left Knee - Routine Post Op    HPI David [name] is status post manipulation of his left knee for adhesive capsulitis. He received the Jahss splint last Friday and feels that it's making a difference. He is able to bear more weight in his left lower extremity. Pain is not significant. Review of Systems   Objective: Vital Signs: BP 125/71   Pulse 75   Resp 16   Ht  (1.778 m)   Wt 297 lb (134.7 kg)   BMI 42.62 kg/m   Physical Exam  Ortho Exam left knee exam without evidence of effusion. No localized areas of tenderness. No instability. Flexed well over 105. Lacked approximately 20 to full extension would certainly is an improvement.  Specialty Comments:  No specialty comments available.  Imaging: No results found.   PMFS History: Patient Active Problem List   Diagnosis Date Noted  . Arthrofibrosis of knee joint, left 09/18/2016   Past Medical History:  Diagnosis Date  . Aneurysm of aorta (HCC)   . Complication of anesthesia    hard to wake up  . Hypertension   .  Sleep apnea     History reviewed. No pertinent family history.  Past Surgical History:  Procedure Laterality Date  . CARPAL TUNNEL RELEASE Right   . EYE SURGERY     d/t cornea laceration  . KNEE ARTHROSCOPY Left   . KNEE CLOSED REDUCTION Left 09/18/2016   Procedure: LEFT CLOSED MANIPULATION KNEE;  Surgeon: Valeria Batman, MD;  Location: [facility] OR;  Service: Orthopedics;  Laterality: Left;   Social History   Occupational History  . Not on file.   Social History Main Topics  . Smoking status: Never Smoker  . Smokeless tobacco: Never Used  . Alcohol use No  . Drug use: No  . Sexual activity: Yes

## 2016-10-04 ENCOUNTER — Encounter: Payer: Self-pay | Admitting: *Deleted

## 2016-10-05 ENCOUNTER — Ambulatory Visit (INDEPENDENT_AMBULATORY_CARE_PROVIDER_SITE_OTHER): Payer: 59 | Admitting: Cardiology

## 2016-10-05 ENCOUNTER — Telehealth: Payer: Self-pay | Admitting: Cardiology

## 2016-10-05 ENCOUNTER — Encounter: Payer: Self-pay | Admitting: Cardiology

## 2016-10-05 VITALS — BP 112/78 | HR 90 | Ht 66.0 in | Wt 299.6 lb

## 2016-10-05 DIAGNOSIS — I10 Essential (primary) hypertension: Secondary | ICD-10-CM

## 2016-10-05 DIAGNOSIS — I712 Thoracic aortic aneurysm, without rupture, unspecified: Secondary | ICD-10-CM

## 2016-10-05 MED ORDER — METOPROLOL SUCCINATE ER 25 MG PO TB24: 12.5000 mg | ORAL_TABLET | Freq: Every day | ORAL | 3 refills | Status: DC

## 2016-10-05 MED ORDER — LISINOPRIL 20 MG PO TABS: 20.0000 mg | ORAL_TABLET | Freq: Every day | ORAL | 3 refills | Status: DC

## 2016-10-05 NOTE — Patient Instructions (Signed)
Your physician wants you to follow-up in: 1 YEAR WITH DR [facility] You will receive a reminder letter in the mail two months in advance. If you don't receive a letter, please call our office to schedule the follow-up appointment.  Your physician has recommended you make the following change in your medication:   START TOPROL XL 12.5 MG DAILY   DECREASE LISINOPRIL 20 MG DAILY  Your physician recommends that you return for lab work PRIOR TO TEST BMP  Non-Cardiac CT Angiography (CTA), is a special type of CT scan that uses a computer to produce multi-dimensional views of major blood vessels throughout the body. In CT angiography, a contrast material is injected through an IV to help visualize the blood vessels  Thank you for choosing [facility]!!

## 2016-10-05 NOTE — Telephone Encounter (Signed)
CT Scan April 19th, 2018 David [name]

## 2016-10-05 NOTE — Progress Notes (Signed)
Clinical Summary Mr. Ortner is a 51 y.o.male seen as new patient, former patient of Dr Molly Maduro. He is referred by Dr Neita Carp  1. Thoracic aortic aneurysm - CT scan 2011 4.5 cm ascending aneurysm according to prior clinic notes.  - f/u echo meausred at 4.7 cm.  - he has not started metoprolol yet    2. HTN - compliant with meds  Past Medical History:  Diagnosis Date  . Aneurysm of aorta (HCC)   . Complication of anesthesia    hard to wake up  . Hypertension   . Sleep apnea      Allergies  Allergen Reactions  . Other Anaphylaxis    All Beans     Current Outpatient Prescriptions  Medication Sig Dispense Refill  . acetaminophen (TYLENOL) 325 MG tablet Take 650 mg by mouth every 6 (six) hours as needed.    Marland Kitchen aspirin EC 81 MG tablet Take 81 mg by mouth daily.     . chlorthalidone (HYGROTON) 25 MG tablet Take 25 mg by mouth daily.     Marland Kitchen lisinopril (PRINIVIL,ZESTRIL) 40 MG tablet Take 40 mg by mouth daily.     . methocarbamol (ROBAXIN) 500 MG tablet Take 1 tablet (500 mg total) by mouth every 6 (six) hours as needed for muscle spasms. (Patient not taking: Reported on 10/03/2016) 30 tablet 0  . metoprolol succinate (TOPROL-XL) 50 MG 24 hr tablet Take 50 mg by mouth daily. Take with or immediately following a meal.    . Multiple Vitamin (MULTIVITAMIN) tablet Take 1 tablet by mouth daily.    Marland Kitchen oxyCODONE-acetaminophen (ROXICET) 5-325 MG tablet Take 1-2 tablets by mouth every 4 (four) hours as needed for severe pain. (Patient not taking: Reported on 10/03/2016) 50 tablet 0   No current facility-administered medications for this visit.      Past Surgical History:  Procedure Laterality Date  . CARPAL TUNNEL RELEASE Right   . EYE SURGERY     d/t cornea laceration  . KNEE ARTHROSCOPY Left   . KNEE CLOSED REDUCTION Left 09/18/2016   Procedure: LEFT CLOSED MANIPULATION KNEE;  Surgeon: Valeria Batman, MD;  Location: [facility] OR;  Service: Orthopedics;  Laterality: Left;      Allergies  Allergen Reactions  . Other Anaphylaxis    All Beans      No family history on file.   Social History Mr. Klees reports that he has never smoked. He has never used smokeless tobacco. Mr. Magallon reports that he does not drink alcohol.   Review of Systems CONSTITUTIONAL: No weight loss, fever, chills, weakness or fatigue.  HEENT: Eyes: No visual loss, blurred vision, double vision or yellow sclerae.No hearing loss, sneezing, congestion, runny nose or sore throat.  SKIN: No rash or itching.  CARDIOVASCULAR: no chest pain RESPIRATORY: No shortness of breath, cough or sputum.  GASTROINTESTINAL: No anorexia, nausea, vomiting or diarrhea. No abdominal pain or blood.  GENITOURINARY: No burning on urination, no polyuria NEUROLOGICAL: No headache, dizziness, syncope, paralysis, ataxia, numbness or tingling in the extremities. No change in bowel or bladder control.  MUSCULOSKELETAL: No muscle, back pain, joint pain or stiffness.  LYMPHATICS: No enlarged nodes. No history of splenectomy.  PSYCHIATRIC: No history of depression or anxiety.  ENDOCRINOLOGIC: No reports of sweating, cold or heat intolerance. No polyuria or polydipsia.  Marland Kitchen   Physical Examination Vitals:   10/05/16 1016 10/05/16 1021  BP: 109/75 112/78  Pulse: 90 90   Vitals:   10/05/16 1016  Weight:  299 lb 9.6 oz (135.9 kg)  Height: 5\' 6"  (1.676 m)    Gen: resting comfortably, no acute distress HEENT: no scleral icterus, pupils equal round and reactive, no palptable cervical adenopathy,  CV: RRR, no m/r/g, no jvd Resp: Clear to auscultation bilaterally GI: abdomen is soft, non-tender, non-distended, normal bowel sounds, no hepatosplenomegaly MSK: extremities are warm, no edema.  Skin: warm, no rash Neuro:  no focal deficits Psych: appropriate affect     Assessment and Plan   1. Aortic aneurysm - he will start beta blocker, Toprol XL 12.5mg  daily.  - we will repeat CTA  2. HTN - start  beta blocker in setting of anerusym, lower lisionpril to <BADT XTTAG>  daily to allow room with bp       Antoine Poche, M.D.

## 2016-10-09 ENCOUNTER — Telehealth: Payer: Self-pay | Admitting: Cardiology

## 2016-10-09 NOTE — Telephone Encounter (Signed)
Wanting to do his lab work at [facility].   Wants lab work sent there and call letting him know they have been sent

## 2016-10-09 NOTE — Telephone Encounter (Signed)
Informed patient that he can take lab order with him to primary that was given day of his office visit.  Patient verbalized understanding.

## 2016-10-17 ENCOUNTER — Ambulatory Visit (INDEPENDENT_AMBULATORY_CARE_PROVIDER_SITE_OTHER): Payer: 59 | Admitting: Orthopaedic Surgery

## 2016-10-17 ENCOUNTER — Encounter (INDEPENDENT_AMBULATORY_CARE_PROVIDER_SITE_OTHER): Payer: Self-pay | Admitting: Orthopaedic Surgery

## 2016-10-17 VITALS — BP 118/82 | HR 70 | Ht 64.0 in | Wt 285.0 lb

## 2016-10-17 DIAGNOSIS — M25562 Pain in left knee: Secondary | ICD-10-CM

## 2016-10-17 DIAGNOSIS — G8929 Other chronic pain: Secondary | ICD-10-CM

## 2016-10-17 NOTE — Progress Notes (Signed)
   Office Visit Note   Patient: David [name]           Date of Birth: [birth year]           MRN: [id number] Visit Date: 10/17/2016              Requested by: Lovey Newcomer, PA [address] [place], [place] [address] PCP: Lovey Newcomer, [place]   Assessment & Plan: Visit Diagnoses:  1. Chronic pain of left knee   Range of motion left knee is improving with a combination of exercises and theJAHSS splint.  Plan: Continue with the splint and the exercises. David [name] would like to return to work Monday the 23rd in a full-time capacity. 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 We'll see back in 1 month  Follow-Up Instructions: Return in about 1 month (around 11/16/2016).   Orders:  No orders of the defined types were placed in this encounter.  No orders of the defined types were placed in this encounter.     Procedures: No procedures performed   Clinical Data: No additional findings.   Subjective: Chief Complaint  Patient presents with  . Left Knee - Routine Post Op   David [name] is status post left knee arthroscopy with lateral release. He developed a significant flexion contracture that required closed manipulation. Since that time he's been using crutches and a, splint and physical therapy and actually is having much less pain and increased range of motion. 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 No local tenderness along the patella or the lateral parapatellar region. No instability. No pain to range of motion of either hip. Full relaxation and some downward pressure on the knee I could obtain -16 of extension and flexion is well beyond 105. Vascular exam intact.  Specialty Comments:  No specialty  comments available.  Imaging: No results found.   PMFS History: Patient Active Problem List   Diagnosis Date Noted  . Arthrofibrosis of knee joint, left 09/18/2016   Past Medical History:  Diagnosis Date  . Aneurysm of aorta (HCC)   . Complication of anesthesia    hard to wake up  . Hypertension   . Sleep apnea     No family history on file.  Past Surgical History:  Procedure Laterality Date  . CARPAL TUNNEL RELEASE Right   . EYE SURGERY     d/t cornea laceration  . KNEE ARTHROSCOPY Left   . KNEE CLOSED REDUCTION Left 09/18/2016   Procedure: LEFT CLOSED MANIPULATION KNEE;  Surgeon: Valeria Batman, MD;  Location: [facility] OR;  Service: Orthopedics;  Laterality: Left;   Social History   Occupational History  . Not on file.   Social History Main Topics  . Smoking status: Never Smoker  . Smokeless tobacco: Never Used  . Alcohol use No  . Drug use: No  . Sexual activity: Yes

## 2016-10-18 ENCOUNTER — Ambulatory Visit (HOSPITAL_COMMUNITY): Payer: 59

## 2016-11-06 ENCOUNTER — Telehealth (INDEPENDENT_AMBULATORY_CARE_PROVIDER_SITE_OTHER): Payer: Self-pay

## 2016-11-06 ENCOUNTER — Telehealth (INDEPENDENT_AMBULATORY_CARE_PROVIDER_SITE_OTHER): Payer: Self-pay | Admitting: Orthopaedic Surgery

## 2016-11-06 NOTE — Telephone Encounter (Signed)
Please advise 

## 2016-11-06 NOTE — Telephone Encounter (Signed)
Patient states he is unable to walk without the assistance of crutches since his left knee surgery in January. He is unable to straighten his leg and his leg has been swelling and painful. He has been using ice as a relief effort but has not had any relief. Patient would like to know what he can do for relief or if he needs to be seen in the office. Please advise.

## 2016-11-06 NOTE — Telephone Encounter (Signed)
Come to [facility] tomorrow

## 2016-11-06 NOTE — Telephone Encounter (Signed)
LVMOM for pt to come into [facility] office on Wed.

## 2016-11-06 NOTE — Telephone Encounter (Signed)
LVMOM for pt to come into [facility] office

## 2016-11-07 ENCOUNTER — Ambulatory Visit (INDEPENDENT_AMBULATORY_CARE_PROVIDER_SITE_OTHER): Payer: 59 | Admitting: Orthopaedic Surgery

## 2016-11-07 ENCOUNTER — Encounter (INDEPENDENT_AMBULATORY_CARE_PROVIDER_SITE_OTHER): Payer: Self-pay | Admitting: Orthopaedic Surgery

## 2016-11-07 VITALS — BP 112/75 | HR 86 | Ht 64.0 in | Wt 265.0 lb

## 2016-11-07 DIAGNOSIS — G8929 Other chronic pain: Secondary | ICD-10-CM | POA: Diagnosis not present

## 2016-11-07 DIAGNOSIS — M25562 Pain in left knee: Secondary | ICD-10-CM

## 2016-11-07 MED ORDER — LIDOCAINE HCL 1 % IJ SOLN
5.0000 mL | INTRAMUSCULAR | Status: AC | PRN
  Administered 2016-11-07: 5 mL

## 2016-11-07 MED ORDER — BUPIVACAINE HCL 0.5 % IJ SOLN
3.0000 mL | INTRAMUSCULAR | Status: AC | PRN
  Administered 2016-11-07: 3 mL via INTRA_ARTICULAR

## 2016-11-07 MED ORDER — METHYLPREDNISOLONE ACETATE 40 MG/ML IJ SUSP
80.0000 mg | INTRAMUSCULAR | Status: AC | PRN
  Administered 2016-11-07: 80 mg

## 2016-11-07 NOTE — Progress Notes (Signed)
Office Visit Note   Patient: David [name]           Date of Birth: [birth year]           MRN: [id number] Visit Date: 11/07/2016              Requested by: Lovey NewcomerBoyd, William S, PA [address] [place], [place] [address] PCP: Lovey NewcomerBoyd, William S, [place]   Assessment & Plan: Visit Diagnoses:  1. Chronic pain of left knee   Recurrent thigh spasm postoperatively associated with adhesive capsulitis. Status post manipulation. Recurrent hamstring spasm. I will plan to inject the area hamstring tenderness posterior laterally. After the injection he had better knee extension but with some pain along the anterior lateral knee joint I injected this with Marcaine and Xylocaine and was able to extend the knee to within 30 of full extension. Continue with the Jahss splint, use a muscle relaxant and let me see him next week, keep him out of work this week.  Follow-Up Instructions: Return in about 1 week (around 11/14/2016).   Orders:  No orders of the defined types were placed in this encounter.  No orders of the defined types were placed in this encounter.     Procedures: Large Joint Inj Date/Time: 11/07/2016 1:16 PM Performed by: Valeria BatmanWHITFIELD, Creedon Danielski W Authorized by: Valeria BatmanWHITFIELD, Keileigh Vahey W   Consent Given by:  Patient Timeout: prior to procedure the correct patient, procedure, and site was verified   Indications:  Pain and joint swelling Location:  Knee Site:  L knee Prep: patient was prepped and draped in usual sterile fashion   Needle Size:  25 G Needle Length:  1.5 inches Approach:  Anteromedial Ultrasound Guidance: No   Fluoroscopic Guidance: No   Arthrogram: No   Medications:  5 mL lidocaine 1 %; 80 mg methylPREDNISolone acetate 40 MG/ML; 3 mL bupivacaine 0.5 % Aspiration Attempted: No   Patient tolerance:  Patient tolerated the procedure well with no immediate complications     Clinical Data: No additional findings.   Subjective: Chief Complaint  Patient presents with  . Left Knee -  Follow-up, Pain, Edema    David [name] is a 51 y o that presents with Left knee pain. 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 Sleep apnea  Mr. Varady's had a very difficult time postoperatively with his left knee. He's over 3 months status post arthroscopic lateral release. He had significant cramping and spasm of his hamstring tendons postoperatively that eventually required manipulation of his knee for adhesive capsulitis we then placed him in a JAHSS splint with resultant full extension of his knee. He went back to work 1 or 2 weeks ago was doing very well. He does drive long distances after sitting in his truck for 8 hours at a time he developed recurrent spasm of his hamstrings the point now is using crutches as he can't fully extend his knee. He's had a full evaluation by his primary care physician with lab work and he notes that he is "fine"he does not experience any knee pain  HPI  Review of Systems   Objective: Vital Signs: BP 112/75   Pulse 86   Ht 5\' 4"  (1.626 m)   Wt 265 lb (120.2 kg)   BMI 45.49 kg/m   Physical Exam  Ortho Exam left knee exam with small effusion. The knee was cyst minimally warm compared to the opposite side. No localized areas  of tenderness about the left knee. On examination he keeps his knee flexed 90 with any with difficulty extending knee beyond that point. He was tender along the hamstring tendons laterally. I injected the area of tenderness with Xylocaine and Depo-Medrol at that point, apical pain about 45 to full extension. He was experiencing pain along the anterolateral joint line. I injected this with a combination of Xylocaine and Marcaine of that that point was able to extend his knee to about 30. I was able to evaluate the knee at that point still no tenderness about the patella or in the area of the lateral release. I placed him in a knee immobilizer and he was able to get into his trauma. I've asked that  he apply the extension splint and take muscle relaxants and see what happens over the rest of the week. We'll keep moderate work until Monday. He's had a very unusual reaction in my experience with hamstring spasm especially after he developed full extension and flexion of his knee without any significant pain was able to return to work  Specialty Comments:  No specialty comments available.  Imaging: No results found.   PMFS History: Patient Active Problem List   Diagnosis Date Noted  . Arthrofibrosis of knee joint, left 09/18/2016   Past Medical History:  Diagnosis Date  . Aneurysm of aorta (HCC)   . Complication of anesthesia    hard to wake up  . Hypertension   . Sleep apnea     History reviewed. No pertinent family history.  Past Surgical History:  Procedure Laterality Date  . CARPAL TUNNEL RELEASE Right   . EYE SURGERY     d/t cornea laceration  . KNEE ARTHROSCOPY Left   . KNEE CLOSED REDUCTION Left 09/18/2016   Procedure: LEFT CLOSED MANIPULATION KNEE;  Surgeon: Valeria Batman, MD;  Location: [facility] OR;  Service: Orthopedics;  Laterality: Left;   Social History   Occupational History  . Not on file.   Social History Main Topics  . Smoking status: Never Smoker  . Smokeless tobacco: Never Used  . Alcohol use No  . Drug use: No  . Sexual activity: Yes     Valeria Batman, MD   Note - This record has been created using [employer].  Chart creation errors have been sought, but may not always  have been located. Such creation errors do not reflect on  the standard of medical care.

## 2016-11-14 ENCOUNTER — Ambulatory Visit (INDEPENDENT_AMBULATORY_CARE_PROVIDER_SITE_OTHER): Payer: 59 | Admitting: Orthopaedic Surgery

## 2016-11-15 ENCOUNTER — Encounter: Payer: Self-pay | Admitting: *Deleted

## 2016-12-01 NOTE — Addendum Note (Signed)
Addendum  created 12/01/16 1051 by Wilkes Potvin, MD   Sign clinical note    

## 2017-10-17 ENCOUNTER — Encounter: Payer: Self-pay | Admitting: Internal Medicine

## 2017-11-27 ENCOUNTER — Ambulatory Visit (INDEPENDENT_AMBULATORY_CARE_PROVIDER_SITE_OTHER): Payer: Self-pay

## 2017-11-27 DIAGNOSIS — Z1211 Encounter for screening for malignant neoplasm of colon: Secondary | ICD-10-CM

## 2017-11-27 NOTE — Progress Notes (Signed)
Gastroenterology Pre-Procedure Review  Request Date:11/27/17 Requesting Physician: Dr.Howard- [facility] ( no previous tcs)   PATIENT REVIEW QUESTIONS: The patient responded to the following health history questions as indicated:    1. Diabetes Melitis: no 2. Joint replacements in the past 12 months: no 3. Major health problems in the past 3 months: no 4. Has an artificial valve or MVP: no 5. Has a defibrillator: no 6. Has been advised in past to take antibiotics in advance of a procedure like teeth cleaning: no 7. Family history of colon cancer: no  8. Alcohol Use: no 9. History of sleep apnea: yes (on cpap)  10. History of coronary artery or other vascular stents placed within the last 12 months: no 11. History of any prior anesthesia complications: yes (hard time waking up, he said it took 4 hours to wake up)    MEDICATIONS & ALLERGIES:    Patient reports the following regarding taking any blood thinners:   Plavix? no Aspirin? yes ( ) Coumadin? no Brilinta? no Xarelto? no Eliquis? no Pradaxa? no Savaysa? no Effient? no  Patient confirms/reports the following medications:  Current Outpatient Medications  Medication Sig Dispense Refill  . aspirin EC 81 MG tablet Take 81 mg by mouth daily.     . chlorthalidone (HYGROTON) 25 MG tablet Take 25 mg by mouth daily.     . indomethacin (INDOCIN) 50 MG capsule indomethacin 50 mg capsule  Take 1 capsule every 8 hours by oral route as needed for 30 days.    Marland Kitchen lisinopril (PRINIVIL,ZESTRIL) 40 MG tablet Take 40 mg by mouth daily.  0  . metoprolol succinate (TOPROL XL) 25 MG 24 hr tablet Take 0.5 tablets (12.5 mg total) by mouth daily. 45 tablet 3  . lisinopril (PRINIVIL,ZESTRIL) 20 MG tablet Take 1 tablet (20 mg total) by mouth daily. 90 tablet 3   No current facility-administered medications for this visit.     Patient confirms/reports the following allergies:  Allergies  Allergen Reactions  . Other Anaphylaxis    All Beans     No orders of the defined types were placed in this encounter.   AUTHORIZATION INFORMATION   SCHEDULE INFORMATION: Procedure has been scheduled as follows:  Date:  Time:  Location:  This Gastroenterology Pre-Precedure Review Form is being routed to the following provider(s): Wynne Dust NP

## 2017-11-27 NOTE — Progress Notes (Signed)
Pt stated when he had his hand surgery 5 years ago and his knee surgery 1 year ago, it took 4 hours for him to wake up from anesthesia. I have not scheduled him, I told him I would have to speak with a provider first. I talked to Minerva Areola, pt will need an office visit to discuss anesthesia prior to scheduling. Misty Stanley, please schedule OV.

## 2017-11-28 ENCOUNTER — Encounter: Payer: Self-pay | Admitting: Internal Medicine

## 2017-11-28 NOTE — Progress Notes (Addendum)
Pt is aware that he has to come in for an OV and is requesting a Monday appointment if possible

## 2017-11-28 NOTE — Progress Notes (Signed)
SCHEDULED PATIENT AND MAILED HIM A LETTER

## 2017-11-29 ENCOUNTER — Encounter: Payer: Self-pay | Admitting: Internal Medicine

## 2017-11-29 NOTE — Progress Notes (Signed)
Ok to schedule.  Please have patient bring CPAP settings with her to endo.  Please notify Endo of "prolonged wakeup time"

## 2017-11-29 NOTE — Progress Notes (Signed)
Changed to a Monday appt and sent letter to patient

## 2017-12-04 ENCOUNTER — Encounter: Payer: Self-pay | Admitting: Internal Medicine

## 2017-12-18 ENCOUNTER — Ambulatory Visit (INDEPENDENT_AMBULATORY_CARE_PROVIDER_SITE_OTHER): Payer: Self-pay

## 2017-12-18 ENCOUNTER — Encounter (INDEPENDENT_AMBULATORY_CARE_PROVIDER_SITE_OTHER): Payer: Self-pay | Admitting: Orthopaedic Surgery

## 2017-12-18 ENCOUNTER — Ambulatory Visit (INDEPENDENT_AMBULATORY_CARE_PROVIDER_SITE_OTHER): Payer: BLUE CROSS/BLUE SHIELD | Admitting: Orthopaedic Surgery

## 2017-12-18 VITALS — BP 126/84 | HR 81 | Ht 66.0 in | Wt 314.0 lb

## 2017-12-18 DIAGNOSIS — M545 Low back pain: Secondary | ICD-10-CM

## 2017-12-18 DIAGNOSIS — M25562 Pain in left knee: Secondary | ICD-10-CM

## 2017-12-18 DIAGNOSIS — G8929 Other chronic pain: Secondary | ICD-10-CM | POA: Diagnosis not present

## 2017-12-18 NOTE — Progress Notes (Addendum)
Office Visit Note   Patient: David [name]           Date of Birth: May 16, 1966           MRN: [id number] Visit Date: 12/18/2017              Requested by: Lovey NewcomerBoyd, William S, PA [address] [place], [place] [address] PCP: Lovey NewcomerBoyd, William S, [place]   Assessment & Plan: Visit Diagnoses:  1. Chronic midline low back pain, with sciatica presence unspecified   2. Chronic pain of left knee     Plan: Chronic pain left knee that appears to be related to the osteoarthritis. See the discussion below.  Agree with short-term disability and a rigorous course of physical therapy.  Office 3 months.  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  I also think that the only definitive procedure would be a knee replacement.  Certainly I am not suggesting that at present but he is aware  that might be the only salvation Follow-Up Instructions: Return in about 3 months (around 03/20/2018).   Orders:  Orders Placed This Encounter  Procedures  . XR Pelvis 1-2 Views  . XR Lumbar Spine 2-3 Views  . XR KNEE 3 VIEW LEFT   No orders of the defined types were placed in this encounter.     Procedures: No procedures performed   Clinical Data: No additional findings.   Subjective: Chief Complaint  Patient presents with  . Left Knee - Pain  . New Patient (Initial Visit)    L KNEE PAIN CANT STRAIGHTEN KNEE HAS LIMPING CAUSING BACK PAIN HX L KNEE ARTHROSCOPY 1 YR AGO  [name] is approximately 16 months status post left knee arthroscopy for debridement of tricompartmental osteoarthritis, synovectomy and an arthroscopic lateral release.  He had a very difficult time postoperatively with pain requiring physical therapy.  Had a very unusual reaction and that his knee went into flexion.  He required a knee immobilizer and active splinting to obtain extension.  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  With continued physical therapy it seemed to have helped.  He is having difficulty functioning as on some days he has significant pain and others he is not to "uncomfortable".  He [facility] for pain in the left elbow was diagnosed with "gout".  He is feeling better.  He also has been seen by his primary care physician, Dr. Bunnie PionScott Boyd, who suggested it might be worthwhile to consider short-term disability and a rigorous course of physical therapy.  He has tried some over-the-counter medicines which seems to give him some temporary relief of his pain. In the past he has had cortisone injections which really "did not help". He also has been [facility] and possibly an orthopedic evaluation.  He thinks he may have had an MRI scan it was suggested that therapy was a good option.  He also believes that he was not  a candidate for  knee replacement HPI  Review of Systems  Constitutional: Positive for fatigue.  HENT: Negative for ear pain.   Eyes: Negative for pain.  Respiratory: Negative for cough.   Cardiovascular: Positive for leg swelling.  Gastrointestinal: Negative for constipation and diarrhea.  Genitourinary: Negative for difficulty urinating.  Musculoskeletal: Positive for back  pain. Negative for neck pain.  Skin: Negative for rash.  Allergic/Immunologic: Negative for food allergies.  Neurological: Positive for weakness and numbness.  Psychiatric/Behavioral: Positive for sleep disturbance.     Objective: Vital Signs: BP 126/84 (BP Location: Left Arm, Patient Position: Sitting, Cuff Size: Normal)   Pulse 81   Ht 5\' 6"  (1.676 m)   Wt (!) 314 lb (142.4 kg)   BMI 50.68 kg/m   Physical Exam  Constitutional: He is oriented to person, place, and time. He appears well-developed and well-nourished.  HENT:  Mouth/Throat: Oropharynx is clear and moist.  Eyes: Pupils are equal, round, and reactive to light. EOM  are normal.  Pulmonary/Chest: Effort normal.  Neurological: He is alert and oriented to person, place, and time.  Skin: Skin is warm and dry.  Psychiatric: He has a normal mood and affect. His behavior is normal.  Ortho Exam awake alert and oriented x3.  Comfortable sitting.  Left knee with considerable patellar crepitation.  No pain along the patella, the medial or lateral joint line.  Lacked approximately 15 degrees to full extension and flex to 108 degrees.  Skin intact.  No calf pain.  Full edema.  Very mild pain with range of motion of both hips.  Has been experiencing some back pain but straight leg raise was negative.  No percussible tenderness of the lumbar spine.  Hamstring tendons left thigh did seem tight    Specialty Comments:  No specialty comments available.  Imaging: Xr Knee 3 View Left  Result Date: 12/18/2017 Films of the left knee were obtained and several projections standing.  There are significant degenerative changes in all 3 compartments with what appears to be some ectopic calcification within the menisci consistent with calcium pyrophosphate deposition.  Patella appears to track in the midline minimal tilt.  Films consistent with advanced osteoarthritis  Xr Lumbar Spine 2-3 Views  Result Date: 12/18/2017 AP lateral lumbar spine demonstrates very minimal right lumbar scoliosis of 2 to 3 degrees.  Abundant bowel gas lateral view reveals some straightening of the normal lumbar lordosis.  Disc spaces are well-maintained.  No listhesis.  No evidence of a compression fracture.  Some sclerosis about the facet joints at L4-5 and L5-S1. no acute changes  Xr Pelvis 1-2 Views  Result Date: 12/18/2017 AP the pelvis demonstrates very minimal degenerative changes both hips no evidence of fracture.  Sacroiliac joints are slightly sclerotic    PMFS History: Patient Active Problem List   Diagnosis Date Noted  . Arthrofibrosis of knee joint, left 09/18/2016   Past Medical  History:  Diagnosis Date  . Aneurysm of aorta (HCC)   . Complication of anesthesia    hard to wake up  . Hypertension   . Sleep apnea     History reviewed. No pertinent family history.  Past Surgical History:  Procedure Laterality Date  . CARPAL TUNNEL RELEASE Right   . EYE SURGERY     d/t cornea laceration  . KNEE ARTHROSCOPY Left   . KNEE CLOSED REDUCTION Left 09/18/2016   Procedure: LEFT CLOSED MANIPULATION KNEE;  Surgeon: Valeria Batman, MD;  Location: [facility] OR;  Service: Orthopedics;  Laterality: Left;   Social History   Occupational History  . Not on file  Tobacco Use  . Smoking status: Never Smoker  . Smokeless tobacco: Never Used  Substance and Sexual Activity  . Alcohol use: No  . Drug use: No  . Sexual activity: Yes

## 2018-01-28 ENCOUNTER — Ambulatory Visit: Payer: BLUE CROSS/BLUE SHIELD | Admitting: Nurse Practitioner

## 2018-03-10 ENCOUNTER — Ambulatory Visit: Payer: BLUE CROSS/BLUE SHIELD | Admitting: Gastroenterology

## 2018-03-11 ENCOUNTER — Encounter: Payer: Self-pay | Admitting: *Deleted

## 2018-03-11 ENCOUNTER — Other Ambulatory Visit: Payer: Self-pay | Admitting: *Deleted

## 2018-03-11 ENCOUNTER — Encounter: Payer: Self-pay | Admitting: Gastroenterology

## 2018-03-11 ENCOUNTER — Ambulatory Visit (INDEPENDENT_AMBULATORY_CARE_PROVIDER_SITE_OTHER): Payer: BLUE CROSS/BLUE SHIELD | Admitting: Gastroenterology

## 2018-03-11 ENCOUNTER — Telehealth: Payer: Self-pay | Admitting: *Deleted

## 2018-03-11 DIAGNOSIS — Z1211 Encounter for screening for malignant neoplasm of colon: Secondary | ICD-10-CM

## 2018-03-11 DIAGNOSIS — T8859XA Other complications of anesthesia, initial encounter: Secondary | ICD-10-CM | POA: Insufficient documentation

## 2018-03-11 DIAGNOSIS — T4145XD Adverse effect of unspecified anesthetic, subsequent encounter: Secondary | ICD-10-CM

## 2018-03-11 DIAGNOSIS — T8859XD Other complications of anesthesia, subsequent encounter: Secondary | ICD-10-CM

## 2018-03-11 DIAGNOSIS — Z8371 Family history of colonic polyps: Secondary | ICD-10-CM | POA: Insufficient documentation

## 2018-03-11 DIAGNOSIS — T4145XA Adverse effect of unspecified anesthetic, initial encounter: Secondary | ICD-10-CM | POA: Insufficient documentation

## 2018-03-11 MED ORDER — PEG 3350-KCL-NA BICARB-NACL 420 G PO SOLR: 4000.0000 mL | Freq: Once | ORAL | 0 refills | Status: AC

## 2018-03-11 NOTE — Patient Instructions (Signed)
1. Colonoscopy in the near future. Please see separate instructions. 

## 2018-03-11 NOTE — Progress Notes (Signed)
cc'ed to pcp °

## 2018-03-11 NOTE — Telephone Encounter (Signed)
Pre-op scheduled for 03/27/18 at 1:45pm. Patient states he is out of town and needs this r/s'd. I provided him with Carolyn's # to get this r/s'd.

## 2018-03-11 NOTE — Progress Notes (Signed)
Primary Care Physician:  Lovey Newcomer, [place]  Primary Gastroenterologist:  Roetta Sessions, MD   Chief Complaint  Patient presents with  . Colonoscopy    needs to discuss anesthesia (see nurse visit)    HPI:  David [name] is a 52 y.o. male here to schedule first ever colonoscopy.  He was referred by Daria Pastures, PA-C.  Patient has a history of anesthesia complications.  He reports having prolonged wake up time after carpal tunnel surgery in 2012 as well as a knee arthroscopy (date unknown).  He reports he did fine after his March 2018 knee surgery.  He has a history of sleep apnea, uses CPAP while he is at home but does not take it with him on the road.  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  He denies any bowel concerns.  No blood in stool or melena.  No abdominal pain.  No upper GI symptoms.  He reports that his father had colon polyps when he was 44.  No family history of colon cancer.   Current Outpatient Medications  Medication Sig Dispense Refill  . aspirin EC 81 MG tablet Take 81 mg by mouth daily.     . chlorthalidone (HYGROTON) 25 MG tablet Take 25 mg by mouth daily.     . indomethacin (INDOCIN) 50 MG capsule indomethacin 50 mg capsule  Take 1 capsule every 8 hours by oral route as needed for 30 days.    Marland Kitchen lisinopril (PRINIVIL,ZESTRIL) 40 MG tablet Take 40 mg by mouth daily.  0  . metoprolol succinate (TOPROL XL) 25 MG 24 hr tablet Take 0.5 tablets (12.5 mg total) by mouth daily. 45 tablet 3   No current facility-administered medications for this visit.     Allergies as of 03/11/2018 - Review Complete 03/11/2018  Allergen Reaction Noted  . Bean pod extract Anaphylaxis 12/27/2014  . Other Anaphylaxis 09/17/2016    Past Medical History:  Diagnosis Date  . Aneurysm of aorta (HCC)   . Complication of anesthesia    hard to wake up after 2012 carpel tunnel, knee arthroscopy. did ok after 2018 knee surgery  .  Hypertension   . Sleep apnea     Past Surgical History:  Procedure Laterality Date  . CARPAL TUNNEL RELEASE Right   . EYE SURGERY     d/t cornea laceration  . KNEE ARTHROSCOPY Left   . KNEE CLOSED REDUCTION Left 09/18/2016   Procedure: LEFT CLOSED MANIPULATION KNEE;  Surgeon: Valeria Batman, MD;  Location: [facility] OR;  Service: Orthopedics;  Laterality: Left;    Family History  Problem Relation Age of Onset  . Prostate cancer Paternal Uncle        multiple  . Colon polyps Father 110  . Colon cancer Neg Hx     Social History   Socioeconomic History  . Marital status: Married    Spouse name: Not on file  . Number of children: Not on file  . Years of education: Not on file  . Highest education level: Not on file  Occupational History  . Not on file  Social Needs  . Financial resource strain: Not on file  . Food insecurity:    Worry: Not on file    Inability: Not on file  . Transportation needs:    Medical: Not on file    Non-medical: Not on file  Tobacco Use  . Smoking status: Never Smoker  .  Smokeless tobacco: Never Used  Substance and Sexual Activity  . Alcohol use: No  . Drug use: No  . Sexual activity: Yes  Lifestyle  . Physical activity:    Days per week: Not on file    Minutes per session: Not on file  . Stress: Not on file  Relationships  . Social connections:    Talks on phone: Not on file    Gets together: Not on file    Attends religious service: Not on file    Active member of club or organization: Not on file    Attends meetings of clubs or organizations: Not on file    Relationship status: Not on file  . Intimate partner violence:    Fear of current or ex partner: Not on file    Emotionally abused: Not on file    Physically abused: Not on file    Forced sexual activity: Not on file  Other Topics Concern  . Not on file  Social History Narrative  . Not on file      ROS:  General: Negative for anorexia, weight loss, fever, chills, fatigue,  weakness. Eyes: Negative for vision changes.  ENT: Negative for hoarseness, difficulty swallowing , nasal congestion. CV: Negative for chest pain, angina, palpitations, dyspnea on exertion, peripheral edema.  Respiratory: Negative for dyspnea at rest, dyspnea on exertion, cough, sputum, wheezing.  GI: See history of present illness. GU:  Negative for dysuria, hematuria, urinary incontinence, urinary frequency, nocturnal urination.  MS: Negative for joint pain, low back pain.  Derm: Negative for rash or itching.  Neuro: Negative for weakness, abnormal sensation, seizure, frequent headaches, memory loss, confusion.  Psych: Negative for anxiety, depression, suicidal ideation, hallucinations.  Endo: Negative for unusual weight change.  Heme: Negative for bruising or bleeding. Allergy: Negative for rash or hives.    Physical Examination:  BP 137/86   Pulse 77   Temp (!) 97.2 F (36.2 C) (Oral)   Ht 5\' 6"  (1.676 m)   Wt (!) 318 lb 3.2 oz (144.3 kg)   BMI 51.36 kg/m    General: Well-nourished, well-developed in no acute distress.  Head: Normocephalic, atraumatic.   Eyes: Conjunctiva pink, no icterus. Mouth: Oropharyngeal mucosa moist and pink , no lesions erythema or exudate. Neck: Supple without thyromegaly, masses, or lymphadenopathy.  Lungs: Clear to auscultation bilaterally.  Heart: Regular rate and rhythm, no murmurs rubs or gallops.  Abdomen: Bowel sounds are normal, nontender, nondistended, no hepatosplenomegaly or masses, no abdominal bruits or    hernia , no rebound or guarding.   Rectal: not performed Extremities: No lower extremity edema. No clubbing or deformities.  Neuro: Alert and oriented x 4 , grossly normal neurologically.  Skin: Warm and dry, no rash or jaundice.   Psych: Alert and cooperative, normal mood and affect.

## 2018-03-11 NOTE — Assessment & Plan Note (Addendum)
52 year old gentleman presenting to schedule first ever colonoscopy.  Father had colon labs at age 36.  Patient denies  bowel concerns.  He has had issues with anesthesia in the past, having difficulty waking up afterwards.  Did well with his March 2018 surgery however.  Plan for colonoscopy with assistance of anesthesiology.  She also has sleep and morbid obesity.  I have discussed the risks, alternatives, benefits with regards to but not limited to the risk of reaction to medication, bleeding, infection, perforation and the patient is agreeable to proceed. Written consent to be obtained.

## 2018-03-11 NOTE — H&P (View-Only) (Signed)
Primary Care Physician:  Boyd, William S, PA  Primary Gastroenterologist:  Michael Rourk, MD   Chief Complaint  Patient presents with  . Colonoscopy    needs to discuss anesthesia (see nurse visit)    HPI:  David [name] is a 52 y.o. male here to schedule first ever colonoscopy.  He was referred by William Boyd, PA-C.  Patient has a history of anesthesia complications.  He reports having prolonged wake up time after carpal tunnel surgery in 2012 as well as a knee arthroscopy (date unknown).  He reports he did fine after his March 2018 knee surgery.  He has a history of sleep apnea, uses CPAP while he is at home but does not take it with him on the road.  He is a truck driver.  States that his truck is not capable of running the CPAP currently.  He denies any bowel concerns.  No blood in stool or melena.  No abdominal pain.  No upper GI symptoms.  He reports that his father had colon polyps when he was 60.  No family history of colon cancer.   Current Outpatient Medications  Medication Sig Dispense Refill  . aspirin EC 81 MG tablet Take 81 mg by mouth daily.     . chlorthalidone (HYGROTON) 25 MG tablet Take 25 mg by mouth daily.     . indomethacin (INDOCIN) 50 MG capsule indomethacin 50 mg capsule  Take 1 capsule every 8 hours by oral route as needed for 30 days.    . lisinopril (PRINIVIL,ZESTRIL) 40 MG tablet Take 40 mg by mouth daily.  0  . metoprolol succinate (TOPROL XL) 25 MG 24 hr tablet Take 0.5 tablets (12.5 mg total) by mouth daily. 45 tablet 3   No current facility-administered medications for this visit.     Allergies as of 03/11/2018 - Review Complete 03/11/2018  Allergen Reaction Noted  . Bean pod extract Anaphylaxis 12/27/2014  . Other Anaphylaxis 09/17/2016    Past Medical History:  Diagnosis Date  . Aneurysm of aorta (HCC)   . Complication of anesthesia    hard to wake up after 2012 carpel tunnel, knee arthroscopy. did ok after 2018 knee surgery  .  Hypertension   . Sleep apnea     Past Surgical History:  Procedure Laterality Date  . CARPAL TUNNEL RELEASE Right   . EYE SURGERY     d/t cornea laceration  . KNEE ARTHROSCOPY Left   . KNEE CLOSED REDUCTION Left 09/18/2016   Procedure: LEFT CLOSED MANIPULATION KNEE;  Surgeon: Peter W Whitfield, MD;  Location: [facility] OR;  Service: Orthopedics;  Laterality: Left;    Family History  Problem Relation Age of Onset  . Prostate cancer Paternal Uncle        multiple  . Colon polyps Father 60  . Colon cancer Neg Hx     Social History   Socioeconomic History  . Marital status: Married    Spouse name: Not on file  . Number of children: Not on file  . Years of education: Not on file  . Highest education level: Not on file  Occupational History  . Not on file  Social Needs  . Financial resource strain: Not on file  . Food insecurity:    Worry: Not on file    Inability: Not on file  . Transportation needs:    Medical: Not on file    Non-medical: Not on file  Tobacco Use  . Smoking status: Never Smoker  .   Smokeless tobacco: Never Used  Substance and Sexual Activity  . Alcohol use: No  . Drug use: No  . Sexual activity: Yes  Lifestyle  . Physical activity:    Days per week: Not on file    Minutes per session: Not on file  . Stress: Not on file  Relationships  . Social connections:    Talks on phone: Not on file    Gets together: Not on file    Attends religious service: Not on file    Active member of club or organization: Not on file    Attends meetings of clubs or organizations: Not on file    Relationship status: Not on file  . Intimate partner violence:    Fear of current or ex partner: Not on file    Emotionally abused: Not on file    Physically abused: Not on file    Forced sexual activity: Not on file  Other Topics Concern  . Not on file  Social History Narrative  . Not on file      ROS:  General: Negative for anorexia, weight loss, fever, chills, fatigue,  weakness. Eyes: Negative for vision changes.  ENT: Negative for hoarseness, difficulty swallowing , nasal congestion. CV: Negative for chest pain, angina, palpitations, dyspnea on exertion, peripheral edema.  Respiratory: Negative for dyspnea at rest, dyspnea on exertion, cough, sputum, wheezing.  GI: See history of present illness. GU:  Negative for dysuria, hematuria, urinary incontinence, urinary frequency, nocturnal urination.  MS: Negative for joint pain, low back pain.  Derm: Negative for rash or itching.  Neuro: Negative for weakness, abnormal sensation, seizure, frequent headaches, memory loss, confusion.  Psych: Negative for anxiety, depression, suicidal ideation, hallucinations.  Endo: Negative for unusual weight change.  Heme: Negative for bruising or bleeding. Allergy: Negative for rash or hives.    Physical Examination:  BP 137/86   Pulse 77   Temp (!) 97.2 F (36.2 C) (Oral)   Ht 5' 6" (1.676 m)   Wt (!) 318 lb 3.2 oz (144.3 kg)   BMI 51.36 kg/m    General: Well-nourished, well-developed in no acute distress.  Head: Normocephalic, atraumatic.   Eyes: Conjunctiva pink, no icterus. Mouth: Oropharyngeal mucosa moist and pink , no lesions erythema or exudate. Neck: Supple without thyromegaly, masses, or lymphadenopathy.  Lungs: Clear to auscultation bilaterally.  Heart: Regular rate and rhythm, no murmurs rubs or gallops.  Abdomen: Bowel sounds are normal, nontender, nondistended, no hepatosplenomegaly or masses, no abdominal bruits or    hernia , no rebound or guarding.   Rectal: not performed Extremities: No lower extremity edema. No clubbing or deformities.  Neuro: Alert and oriented x 4 , grossly normal neurologically.  Skin: Warm and dry, no rash or jaundice.   Psych: Alert and cooperative, normal mood and affect.   

## 2018-03-27 ENCOUNTER — Inpatient Hospital Stay (HOSPITAL_COMMUNITY): Admission: RE | Admit: 2018-03-27 | Payer: BLUE CROSS/BLUE SHIELD | Source: Ambulatory Visit

## 2018-03-28 NOTE — Patient Instructions (Signed)
David [name]  03/28/2018     @PREFPERIOPPHARMACY @   Your procedure is scheduled on  04/03/2018.  Report to [facility] at  1130  A.M.  Call this number if you have problems the morning of surgery:  [phone]   Remember:  Do not eat or drink after midnight.  You may drink clear liquids until (follow the instructions given to you) .  Clear liquids allowed are:                    Water, Juice (non-citric and without pulp), Carbonated beverages, Clear Tea, Black Coffee only, Plain Jell-O only, Gatorade and Plain Popsicles only    Take these medicines the morning of surgery with A SIP OF WATER  Hygroton, lisinopril, metoprolol.    Do not wear jewelry, make-up or nail polish.  Do not wear lotions, powders, or perfumes, or deodorant.  Do not shave 48 hours prior to surgery.  Men may shave face and neck.  Do not bring valuables to the hospital.  [facility] is not responsible for any belongings or valuables.  Contacts, dentures or bridgework may not be worn into surgery.  Leave your suitcase in the car.  After surgery it may be brought to your room.  For patients admitted to the hospital, discharge time will be determined by your treatment team.  Patients discharged the day of surgery will not be allowed to drive home.   Name and phone number of your driver:   family Special instructions:  Follow the diet and prep instructions given to you by Dr Luvenia Starch office.  Please read over the following fact sheets that you were given. Anesthesia Post-op Instructions and Care and Recovery After Surgery       Colonoscopy, Adult A colonoscopy is an exam to look at the large intestine. It is done to check for problems, such as:  Lumps (tumors).  Growths (polyps).  Swelling (inflammation).  Bleeding.  What happens before the procedure? Eating and drinking Follow instructions from your doctor about eating and drinking. These instructions may include:  A few days  before the procedure - follow a low-fiber diet. ? Avoid nuts. ? Avoid seeds. ? Avoid dried fruit. ? Avoid raw fruits. ? Avoid vegetables.  1-3 days before the procedure - follow a clear liquid diet. Avoid liquids that have red or purple dye. Drink only clear liquids, such as: ? Clear broth or bouillon. ? Black coffee or tea. ? Clear juice. ? Clear soft drinks or sports drinks. ? Gelatin dessert. ? Popsicles.  On the day of the procedure - do not eat or drink anything during the 2 hours before the procedure.  Bowel prep If you were prescribed an oral bowel prep:  Take it as told by your doctor. Starting the day before your procedure, you will need to drink a lot of liquid. The liquid will cause you to poop (have bowel movements) until your poop is almost clear or light green.  If your skin or butt gets irritated from diarrhea, you may: ? Wipe the area with wipes that have medicine in them, such as adult wet wipes with aloe and vitamin E. ? Put something on your skin that soothes the area, such as petroleum jelly.  If you throw up (vomit) while drinking the bowel prep, take a break for up to 60 minutes. Then begin the bowel prep again. If you keep throwing up and you  cannot take the bowel prep without throwing up, call your doctor.  General instructions  Ask your doctor about changing or stopping your normal medicines. This is important if you take diabetes medicines or blood thinners.  Plan to have someone take you home from the hospital or clinic. What happens during the procedure?  An IV tube may be put into one of your veins.  You will be given medicine to help you relax (sedative).  To reduce your risk of infection: ? Your doctors will wash their hands. ? Your anal area will be washed with soap.  You will be asked to lie on your side with your knees bent.  Your doctor will get a long, thin, flexible tube ready. The tube will have a camera and a light on the end.  The  tube will be put into your anus.  The tube will be gently put into your large intestine.  Air will be delivered into your large intestine to keep it open. You may feel some pressure or cramping.  The camera will be used to take photos.  A small tissue sample may be removed from your body to be looked at under a microscope (biopsy). If any possible problems are found, the tissue will be sent to a lab for testing.  If small growths are found, your doctor may remove them and have them checked for cancer.  The tube that was put into your anus will be slowly removed. The procedure may vary among doctors and hospitals. What happens after the procedure?  Your doctor will check on you often until the medicines you were given have worn off.  Do not drive for 24 hours after the procedure.  You may have a small amount of blood in your poop.  You may pass gas.  You may have mild cramps or bloating in your belly (abdomen).  It is up to you to get the results of your procedure. Ask your doctor, or the department performing the procedure, when your results will be ready. This information is not intended to replace advice given to you by your health care provider. Make sure you discuss any questions you have with your health care provider. Document Released: 07/21/2010 Document Revised: 04/18/2016 Document Reviewed: 08/30/2015 Elsevier Interactive Patient Education  2017 [employer]  Colonoscopy, Adult, Care After This sheet gives you information about how to care for yourself after your procedure. Your health care provider may also give you more specific instructions. If you have problems or questions, contact your health care provider. What can I expect after the procedure? After the procedure, it is common to have:  A small amount of blood in your stool for 24 hours after the procedure.  Some gas.  Mild abdominal cramping or bloating.  Follow these instructions at home: General  instructions   For the first 24 hours after the procedure: ? Do not drive or use machinery. ? Do not sign important documents. ? Do not drink alcohol. ? Do your regular daily activities at a slower pace than normal. ? Eat soft, easy-to-digest foods. ? Rest often.  Take over-the-counter or prescription medicines only as told by your health care provider.  It is up to you to get the results of your procedure. Ask your health care provider, or the department performing the procedure, when your results will be ready. Relieving cramping and bloating  Try walking around when you have cramps or feel bloated.  Apply heat to your abdomen as told by  your health care provider. Use a heat source that your health care provider recommends, such as a moist heat pack or a heating pad. ? Place a towel between your skin and the heat source. ? Leave the heat on for 20-30 minutes. ? Remove the heat if your skin turns bright red. This is especially important if you are unable to feel pain, heat, or cold. You may have a greater risk of getting burned. Eating and drinking  Drink enough fluid to keep your urine clear or pale yellow.  Resume your normal diet as instructed by your health care provider. Avoid heavy or fried foods that are hard to digest.  Avoid drinking alcohol for as long as instructed by your health care provider. Contact a health care provider if:  You have blood in your stool 2-3 days after the procedure. Get help right away if:  You have more than a small spotting of blood in your stool.  You pass large blood clots in your stool.  Your abdomen is swollen.  You have nausea or vomiting.  You have a fever.  You have increasing abdominal pain that is not relieved with medicine. This information is not intended to replace advice given to you by your health care provider. Make sure you discuss any questions you have with your health care provider. Document Released: 01/31/2004  Document Revised: 03/12/2016 Document Reviewed: 08/30/2015 Elsevier Interactive Patient Education  2018 [place] Anesthesia is a term that refers to techniques, procedures, and medicines that help a person stay safe and comfortable during a medical procedure. Monitored anesthesia care, or sedation, is one type of anesthesia. Your anesthesia specialist may recommend sedation if you will be having a procedure that does not require you to be unconscious, such as:  Cataract surgery.  A dental procedure.  A biopsy.  A colonoscopy.  During the procedure, you may receive a medicine to help you relax (sedative). There are three levels of sedation:  Mild sedation. At this level, you may feel awake and relaxed. You will be able to follow directions.  Moderate sedation. At this level, you will be sleepy. You may not remember the procedure.  Deep sedation. At this level, you will be asleep. You will not remember the procedure.  The more medicine you are given, the deeper your level of sedation will be. Depending on how you respond to the procedure, the anesthesia specialist may change your level of sedation or the type of anesthesia to fit your needs. An anesthesia specialist will monitor you closely during the procedure. Let your health care provider know about:  Any allergies you have.  All medicines you are taking, including vitamins, herbs, eye drops, creams, and over-the-counter medicines.  Any use of steroids (by mouth or as a cream).  Any problems you or family members have had with sedatives and anesthetic medicines.  Any blood disorders you have.  Any surgeries you have had.  Any medical conditions you have, such as sleep apnea.  Whether you are pregnant or may be pregnant.  Any use of cigarettes, alcohol, or street drugs. What are the risks? Generally, this is a safe procedure. However, problems may occur, including:  Getting too much  medicine (oversedation).  Nausea.  Allergic reaction to medicines.  Trouble breathing. If this happens, a breathing tube may be used to help with breathing. It will be removed when you are awake and breathing on your own.  Heart trouble.  Lung trouble.  Before  the procedure Staying hydrated Follow instructions from your health care provider about hydration, which may include:  Up to 2 hours before the procedure - you may continue to drink clear liquids, such as water, clear fruit juice, black coffee, and plain tea.  Eating and drinking restrictions Follow instructions from your health care provider about eating and drinking, which may include:  8 hours before the procedure - stop eating heavy meals or foods such as meat, fried foods, or fatty foods.  6 hours before the procedure - stop eating light meals or foods, such as toast or cereal.  6 hours before the procedure - stop drinking milk or drinks that contain milk.  2 hours before the procedure - stop drinking clear liquids.  Medicines Ask your health care provider about:  Changing or stopping your regular medicines. This is especially important if you are taking diabetes medicines or blood thinners.  Taking medicines such as aspirin and ibuprofen. These medicines can thin your blood. Do not take these medicines before your procedure if your health care provider instructs you not to.  Tests and exams  You will have a physical exam.  You may have blood tests done to show: ? How well your kidneys and liver are working. ? How well your blood can clot.  General instructions  Plan to have someone take you home from the hospital or clinic.  If you will be going home right after the procedure, plan to have someone with you for 24 hours.  What happens during the procedure?  Your blood pressure, heart rate, breathing, level of pain and overall condition will be monitored.  An IV tube will be inserted into one of your  veins.  Your anesthesia specialist will give you medicines as needed to keep you comfortable during the procedure. This may mean changing the level of sedation.  The procedure will be performed. After the procedure  Your blood pressure, heart rate, breathing rate, and blood oxygen level will be monitored until the medicines you were given have worn off.  Do not drive for 24 hours if you received a sedative.  You may: ? Feel sleepy, clumsy, or nauseous. ? Feel forgetful about what happened after the procedure. ? Have a sore throat if you had a breathing tube during the procedure. ? Vomit. This information is not intended to replace advice given to you by your health care provider. Make sure you discuss any questions you have with your health care provider. Document Released: 03/14/2005 Document Revised: 11/25/2015 Document Reviewed: 10/09/2015 Elsevier Interactive Patient Education  2018 [place], Care After These instructions provide you with information about caring for yourself after your procedure. Your health care provider may also give you more specific instructions. Your treatment has been planned according to current medical practices, but problems sometimes occur. Call your health care provider if you have any problems or questions after your procedure. What can I expect after the procedure? After your procedure, it is common to:  Feel sleepy for several hours.  Feel clumsy and have poor balance for several hours.  Feel forgetful about what happened after the procedure.  Have poor judgment for several hours.  Feel nauseous or vomit.  Have a sore throat if you had a breathing tube during the procedure.  Follow these instructions at home: For at least 24 hours after the procedure:   Do not: ? Participate in activities in which you could fall or become injured. ? Drive. ? Use  heavy machinery. ? Drink alcohol. ? Take sleeping pills or  medicines that cause drowsiness. ? Make important decisions or sign legal documents. ? Take care of children on your own.  Rest. Eating and drinking  Follow the diet that is recommended by your health care provider.  If you vomit, drink water, juice, or soup when you can drink without vomiting.  Make sure you have little or no nausea before eating solid foods. General instructions  Have a responsible adult stay with you until you are awake and alert.  Take over-the-counter and prescription medicines only as told by your health care provider.  If you smoke, do not smoke without supervision.  Keep all follow-up visits as told by your health care provider. This is important. Contact a health care provider if:  You keep feeling nauseous or you keep vomiting.  You feel light-headed.  You develop a rash.  You have a fever. Get help right away if:  You have trouble breathing. This information is not intended to replace advice given to you by your health care provider. Make sure you discuss any questions you have with your health care provider. Document Released: 10/09/2015 Document Revised: 02/08/2016 Document Reviewed: 10/09/2015 Elsevier Interactive Patient Education  [employer].

## 2018-04-01 ENCOUNTER — Encounter (HOSPITAL_COMMUNITY)
Admission: RE | Admit: 2018-04-01 | Discharge: 2018-04-01 | Disposition: A | Discharge status: Home / Self Care | Payer: BLUE CROSS/BLUE SHIELD | Source: Ambulatory Visit | Attending: Internal Medicine | Admitting: Internal Medicine

## 2018-04-01 ENCOUNTER — Encounter (HOSPITAL_COMMUNITY): Payer: Self-pay

## 2018-04-01 ENCOUNTER — Other Ambulatory Visit: Payer: Self-pay

## 2018-04-01 DIAGNOSIS — I4510 Unspecified right bundle-branch block: Secondary | ICD-10-CM | POA: Insufficient documentation

## 2018-04-01 DIAGNOSIS — Z01812 Encounter for preprocedural laboratory examination: Secondary | ICD-10-CM | POA: Diagnosis not present

## 2018-04-01 DIAGNOSIS — I10 Essential (primary) hypertension: Secondary | ICD-10-CM | POA: Diagnosis not present

## 2018-04-01 DIAGNOSIS — Z01810 Encounter for preprocedural cardiovascular examination: Secondary | ICD-10-CM | POA: Insufficient documentation

## 2018-04-01 HISTORY — DX: Unspecified osteoarthritis, unspecified site: M19.90

## 2018-04-01 HISTORY — DX: Gout, unspecified: M10.9

## 2018-04-01 LAB — BASIC METABOLIC PANEL
ANION GAP: 8 (ref 5–15)
BUN: 14 mg/dL (ref 6–20)
CO2: 27 mmol/L (ref 22–32)
Calcium: 8.9 mg/dL (ref 8.9–10.3)
Chloride: 104 mmol/L (ref 98–111)
Creatinine, Ser: 1.41 mg/dL — ABNORMAL HIGH (ref 0.61–1.24)
GFR calc Af Amer: >60 mL/min (ref >60)
GFR calc non Af Amer: 56 mL/min — ABNORMAL LOW (ref >60)
GLUCOSE: 110 mg/dL — AB (ref 70–99)
POTASSIUM: 3.3 mmol/L — AB (ref 3.5–5.1)
Sodium: 139 mmol/L (ref 135–145)

## 2018-04-01 LAB — CBC
HEMATOCRIT: 43.3 % (ref 39.0–52.0)
Hemoglobin: 14.3 g/dL (ref 13.0–17.0)
MCH: 27.4 pg (ref 26.0–34.0)
MCHC: 33 g/dL (ref 30.0–36.0)
MCV: 83 fL (ref 78.0–100.0)
PLATELETS: 192 10*3/uL (ref 150–400)
RBC: 5.22 MIL/uL (ref 4.22–5.81)
RDW: 14.8 % (ref 11.5–15.5)
WBC: 8.7 10*3/uL (ref 4.0–10.5)

## 2018-04-02 ENCOUNTER — Other Ambulatory Visit: Payer: Self-pay

## 2018-04-02 MED ORDER — POTASSIUM CHLORIDE 20 MEQ PO PACK: 20.0000 meq | PACK | Freq: Two times a day (BID) | ORAL | 0 refills | Status: DC

## 2018-04-03 ENCOUNTER — Ambulatory Visit (HOSPITAL_COMMUNITY): Payer: BLUE CROSS/BLUE SHIELD | Admitting: Anesthesiology

## 2018-04-03 ENCOUNTER — Other Ambulatory Visit: Payer: Self-pay

## 2018-04-03 ENCOUNTER — Encounter (HOSPITAL_COMMUNITY): Payer: Self-pay | Admitting: Anesthesiology

## 2018-04-03 ENCOUNTER — Encounter (HOSPITAL_COMMUNITY)
Admission: RE | Disposition: A | Discharge status: Home / Self Care | Payer: Self-pay | Source: Ambulatory Visit | Attending: Internal Medicine

## 2018-04-03 ENCOUNTER — Ambulatory Visit (HOSPITAL_COMMUNITY)
Admission: RE | Admit: 2018-04-03 | Discharge: 2018-04-03 | Disposition: A | Discharge status: Home / Self Care | Payer: BLUE CROSS/BLUE SHIELD | Source: Ambulatory Visit | Attending: Internal Medicine | Admitting: Internal Medicine

## 2018-04-03 DIAGNOSIS — Z7982 Long term (current) use of aspirin: Secondary | ICD-10-CM | POA: Insufficient documentation

## 2018-04-03 DIAGNOSIS — Z8042 Family history of malignant neoplasm of prostate: Secondary | ICD-10-CM | POA: Insufficient documentation

## 2018-04-03 DIAGNOSIS — Z8371 Family history of colonic polyps: Secondary | ICD-10-CM | POA: Diagnosis not present

## 2018-04-03 DIAGNOSIS — Z79899 Other long term (current) drug therapy: Secondary | ICD-10-CM | POA: Diagnosis not present

## 2018-04-03 DIAGNOSIS — Z91018 Allergy to other foods: Secondary | ICD-10-CM | POA: Insufficient documentation

## 2018-04-03 DIAGNOSIS — Z1211 Encounter for screening for malignant neoplasm of colon: Secondary | ICD-10-CM | POA: Insufficient documentation

## 2018-04-03 DIAGNOSIS — G4730 Sleep apnea, unspecified: Secondary | ICD-10-CM | POA: Diagnosis not present

## 2018-04-03 DIAGNOSIS — I10 Essential (primary) hypertension: Secondary | ICD-10-CM | POA: Diagnosis not present

## 2018-04-03 DIAGNOSIS — Z87892 Personal history of anaphylaxis: Secondary | ICD-10-CM | POA: Diagnosis not present

## 2018-04-03 HISTORY — PX: COLONOSCOPY WITH PROPOFOL: SHX5780

## 2018-04-03 SURGERY — COLONOSCOPY WITH PROPOFOL
Anesthesia: Monitor Anesthesia Care

## 2018-04-03 MED ORDER — PROPOFOL 10 MG/ML IV BOLUS
INTRAVENOUS | Status: DC | PRN
  Administered 2018-04-03 (×3): 40 mg via INTRAVENOUS

## 2018-04-03 MED ORDER — PROPOFOL 500 MG/50ML IV EMUL
INTRAVENOUS | Status: DC | PRN
  Administered 2018-04-03: 150 ug/kg/min via INTRAVENOUS

## 2018-04-03 MED ORDER — LACTATED RINGERS IV SOLN
INTRAVENOUS | Status: DC
  Administered 2018-04-03: 14:00:00 via INTRAVENOUS

## 2018-04-03 NOTE — Progress Notes (Signed)
David [name] cannot drive, operate heavy machinery, or sign legal documents until after 4pm on Friday October 4th, 2019.

## 2018-04-03 NOTE — Op Note (Signed)
[facility] Patient Name: David [name] Procedure Date: 04/03/2018 2:35 PM MRN: [id number] Date of Birth: Dec 31, 1965 Attending MD: Gennette Pac , MD CSN: [id number] Age: 52 Admit Type: Outpatient Procedure:                Colonoscopy Indications:              Screening for colon cancer: Family history of                            1st-degree relative with colon polyps before age 25                            years Providers:                Gennette Pac, MD, Nena Polio, RN, Burke Keels, Technician Referring MD:              Medicines:                Propofol per Anesthesia Complications:            No immediate complications. Estimated Blood Loss:     Estimated blood loss: none. Procedure:                Pre-Anesthesia Assessment:                           - Prior to the procedure, a History and Physical                            was performed, and patient medications and                            allergies were reviewed. The patient's tolerance of                            previous anesthesia was also reviewed. The risks                            and benefits of the procedure and the sedation                            options and risks were discussed with the patient.                            All questions were answered, and informed consent                            was obtained. Prior Anticoagulants: The patient has                            taken no previous anticoagulant or antiplatelet                            agents. ASA  Grade Assessment: II - A patient with                            mild systemic disease. After reviewing the risks                            and benefits, the patient was deemed in                            satisfactory condition to undergo the procedure.                           After obtaining informed consent, the colonoscope                            was passed under direct vision. Throughout  the                            procedure, the patient's blood pressure, pulse, and                            oxygen saturations were monitored continuously. The                            [id number]-[id number] ([id number]) scope was introduced through                            the and advanced to the the cecum, identified by                            appendiceal orifice and ileocecal valve. The                            colonoscopy was performed without difficulty. The                            patient tolerated the procedure well. The quality                            of the bowel preparation was adequate. Scope In: 2:57:48 PM Scope Out: 3:10:55 PM Scope Withdrawal Time: 0 hours 10 minutes 54 seconds  Total Procedure Duration: 0 hours 13 minutes 7 seconds  Findings:      The perianal and digital rectal examinations were normal.      The colon (entire examined portion) appeared normal.      The retroflexed view of the distal rectum and anal verge was normal and       showed no anal or rectal abnormalities. Impression:               - The entire examined colon is normal.                           - The distal rectum and anal verge are normal on  retroflexion view.                           - No specimens collected. Moderate Sedation:      Moderate (conscious) sedation was personally administered by an       anesthesia professional. The following parameters were monitored: oxygen       saturation, heart rate, blood pressure, and response to care. Total       physician intraservice time was 18 minutes. Recommendation:           - Patient has a contact number available for                            emergencies. The signs and symptoms of potential                            delayed complications were discussed with the                            patient. Return to normal activities tomorrow.                            Written discharge instructions were provided to the                             patient.                           - Resume previous diet.                           - Continue present medications.                           - Repeat colonoscopy in 5 years for screening                            purposes.                           - Return to GI office PRN. Procedure Code(s):        --- Professional ---                           [phone], Colonoscopy, flexible; diagnostic, including                            collection of specimen(s) by brushing or washing,                            when performed (separate procedure) Diagnosis Code(s):        --- Professional ---                           Z12.11, Encounter for screening for malignant  neoplasm of colon                           Z83.71, Family history of colonic polyps CPT copyright 2017 American Medical Association. All rights reserved. The codes documented in this report are preliminary and upon coder review may  be revised to meet current compliance requirements. [name], MD Gennette Pac, MD 04/03/2018 3:18:01 PM This report has been signed electronically. Number of Addenda: 0

## 2018-04-03 NOTE — Discharge Instructions (Signed)
Colonoscopy Discharge Instructions  Read the instructions outlined below and refer to this sheet in the next few weeks. These discharge instructions provide you with general information on caring for yourself after you leave the hospital. Your doctor may also give you specific instructions. While your treatment has been planned according to the most current medical practices available, unavoidable complications occasionally occur. If you have any problems or questions after discharge, call Dr. Jena Gauss at [phone]. ACTIVITY  You may resume your regular activity, but move at a slower pace for the next 24 hours.   Take frequent rest periods for the next 24 hours.   Walking will help get rid of the air and reduce the bloated feeling in your belly (abdomen).   No driving for 24 hours (because of the medicine (anesthesia) used during the test).    Do not sign any important legal documents or operate any machinery for 24 hours (because of the anesthesia used during the test).  NUTRITION  Drink plenty of fluids.   You may resume your normal diet as instructed by your doctor.   Begin with a light meal and progress to your normal diet. Heavy or fried foods are harder to digest and may make you feel sick to your stomach (nauseated).   Avoid alcoholic beverages for 24 hours or as instructed.  MEDICATIONS  You may resume your normal medications unless your doctor tells you otherwise.  WHAT YOU CAN EXPECT TODAY  Some feelings of bloating in the abdomen.   Passage of more gas than usual.   Spotting of blood in your stool or on the toilet paper.  IF YOU HAD POLYPS REMOVED DURING THE COLONOSCOPY:  No aspirin products for 7 days or as instructed.   No alcohol for 7 days or as instructed.   Eat a soft diet for the next 24 hours.  FINDING OUT THE RESULTS OF YOUR TEST Not all test results are available during your visit. If your test results are not back during the visit, make an appointment  with your caregiver to find out the results. Do not assume everything is normal if you have not heard from your caregiver or the medical facility. It is important for you to follow up on all of your test results.  SEEK IMMEDIATE MEDICAL ATTENTION IF:  You have more than a spotting of blood in your stool.   Your belly is swollen (abdominal distention).   You are nauseated or vomiting.   You have a temperature over 101.   You have abdominal pain or discomfort that is severe or gets worse throughout the day.    Your colonoscopy was normal today.  I recommend you have another screening colonoscopy in 5 years.     Monitored Anesthesia Care, Care After These instructions provide you with information about caring for yourself after your procedure. Your health care provider may also give you more specific instructions. Your treatment has been planned according to current medical practices, but problems sometimes occur. Call your health care provider if you have any problems or questions after your procedure. What can I expect after the procedure? After your procedure, it is common to:  Feel sleepy for several hours.  Feel clumsy and have poor balance for several hours.  Feel forgetful about what happened after the procedure.  Have poor judgment for several hours.  Feel nauseous or vomit.  Have a sore throat if you had a breathing tube during the procedure.  Follow these instructions at home: For  at least 24 hours after the procedure:   Do not: ? Participate in activities in which you could fall or become injured. ? Drive. ? Use heavy machinery. ? Drink alcohol. ? Take sleeping pills or medicines that cause drowsiness. ? Make important decisions or sign legal documents. ? Take care of children on your own.  Rest. Eating and drinking  Follow the diet that is recommended by your health care provider.  If you vomit, drink water, juice, or soup when you can drink without  vomiting.  Make sure you have little or no nausea before eating solid foods. General instructions  Have a responsible adult stay with you until you are awake and alert.  Take over-the-counter and prescription medicines only as told by your health care provider.  If you smoke, do not smoke without supervision.  Keep all follow-up visits as told by your health care provider. This is important. Contact a health care provider if:  You keep feeling nauseous or you keep vomiting.  You feel light-headed.  You develop a rash.  You have a fever. Get help right away if:  You have trouble breathing. This information is not intended to replace advice given to you by your health care provider. Make sure you discuss any questions you have with your health care provider. Document Released: 10/09/2015 Document Revised: 02/08/2016 Document Reviewed: 10/09/2015 Elsevier Interactive Patient Education  [employer].

## 2018-04-03 NOTE — Anesthesia Procedure Notes (Signed)
Procedure Name: MAC Date/Time: 04/03/2018 2:47 PM Performed by: Andree Elk Esthefany Herrig A, CRNA Pre-anesthesia Checklist: Patient identified, Emergency Drugs available, Suction available, Patient being monitored and Timeout performed Oxygen Delivery Method: Simple face mask

## 2018-04-03 NOTE — Anesthesia Postprocedure Evaluation (Addendum)
Anesthesia Post Note  Patient: [name]  Procedure(s) Performed: COLONOSCOPY WITH PROPOFOL (N/A )  Patient location during evaluation: PACU Anesthesia Type: MAC Level of consciousness: awake and alert and oriented Pain management: pain level controlled Vital Signs Assessment: post-procedure vital signs reviewed and stable Respiratory status: spontaneous breathing Cardiovascular status: blood pressure returned to baseline and stable Postop Assessment: no apparent nausea or vomiting Anesthetic complications: no     Last Vitals:  Vitals:   04/03/18 1134 04/03/18 1520  BP: 122/85   Pulse: 84 85  Resp: 18 20  Temp: 36.7 C 36.5 C  SpO2: 97% 95%    Last Pain:  Vitals:   04/03/18 1520  TempSrc:   PainSc: 0-No pain                 Correll Denbow

## 2018-04-03 NOTE — Progress Notes (Signed)
Please excuse David [name] from work. She is caring for her husband who cannot drive, operate heavy machinery or sign legal documents until after 4pm October 4th, 2019.

## 2018-04-03 NOTE — Anesthesia Preprocedure Evaluation (Addendum)
Anesthesia Evaluation  Patient identified by MRN, date of birth, ID band Patient awake    Reviewed: Allergy & Precautions, H&P , NPO status , Patient's Chart, lab work & pertinent test results, reviewed documented beta blocker date and time   Airway Mallampati: III  TM Distance: >3 FB Neck ROM: full    Dental no notable dental hx. (+) Teeth Intact, Dental Advidsory Given   Pulmonary neg pulmonary ROS, sleep apnea ,    Pulmonary exam normal breath sounds clear to auscultation       Cardiovascular Exercise Tolerance: Good hypertension, negative cardio ROS   Rhythm:regular Rate:Normal     Neuro/Psych negative neurological ROS  negative psych ROS   GI/Hepatic negative GI ROS, Neg liver ROS,   Endo/Other  negative endocrine ROS  Renal/GU negative Renal ROS  negative genitourinary   Musculoskeletal   Abdominal   Peds  Hematology negative hematology ROS (+)   Anesthesia Other Findings NRS 70 incomplete RBBB obesity   Reproductive/Obstetrics negative OB ROS                             Anesthesia Physical Anesthesia Plan  ASA: II  Anesthesia Plan: MAC   Post-op Pain Management:    Induction:   PONV Risk Score and Plan:   Airway Management Planned:   Additional Equipment:   Intra-op Plan:   Post-operative Plan:   Informed Consent: I have reviewed the patients History and Physical, chart, labs and discussed the procedure including the risks, benefits and alternatives for the proposed anesthesia with the patient or authorized representative who has indicated his/her understanding and acceptance.   Dental Advisory Given  Plan Discussed with: CRNA and Anesthesiologist  Anesthesia Plan Comments:        Anesthesia Quick Evaluation

## 2018-04-03 NOTE — Interval H&P Note (Signed)
History and Physical Interval Note:  04/03/2018 1:31 PM  David [name]  has presented today for surgery, with the diagnosis of FH colon polyps, screening colonoscopy, anestesia complication  The various methods of treatment have been discussed with the patient and family. After consideration of risks, benefits and other options for treatment, the patient has consented to  Procedure(s) with comments: COLONOSCOPY WITH PROPOFOL (N/A) - 1:00pm as a surgical intervention .  The patient's history has been reviewed, patient examined, no change in status, stable for surgery.  I have reviewed the patient's chart and labs.  Questions were answered to the patient's satisfaction.     David [name]  No change.  Screening colonoscopy per plan today.   The risks, benefits, limitations, alternatives and imponderables have been reviewed with the patient. Questions have been answered. All parties are agreeable.

## 2018-04-03 NOTE — Transfer of Care (Signed)
Immediate Anesthesia Transfer of Care Note  Patient: David [name]  Procedure(s) Performed: COLONOSCOPY WITH PROPOFOL (N/A )  Patient Location: PACU  Anesthesia Type:MAC  Level of Consciousness: awake, alert  and oriented  Airway & Oxygen Therapy: Patient Spontanous Breathing  Post-op Assessment: Report given to RN  Post vital signs: Reviewed and stable  Last Vitals:  Vitals Value Taken Time  BP 88/49 04/03/2018  3:20 PM  Temp    Pulse 83 04/03/2018  3:22 PM  Resp 21 04/03/2018  3:22 PM  SpO2 94 % 04/03/2018  3:22 PM  Vitals shown include unvalidated device data.  Last Pain:  Vitals:   04/03/18 1134  TempSrc: Oral  PainSc: 0-No pain         Complications: No apparent anesthesia complications

## 2018-04-04 NOTE — Addendum Note (Signed)
Addendum  created 04/04/18 1256 by Moshe Salisbury, CRNA   Sign clinical note, SmartForm saved

## 2018-04-08 ENCOUNTER — Encounter (HOSPITAL_COMMUNITY): Payer: Self-pay | Admitting: Internal Medicine

## 2018-04-16 ENCOUNTER — Telehealth: Payer: Self-pay | Admitting: Cardiology

## 2018-04-16 NOTE — Telephone Encounter (Signed)
Numerous attempts to contact patient with recall letters. Unable to reach by telephone. with no success.   David [name] [[id number]] 10/05/2016 11:02 AM New [10]    [System] 07/02/2017 11:01 PM Notification Sent [20]   David [name] [[id number]] 10/07/2017 8:48 AM Notification Sent [20]   David [name] [[id number]] 03/11/2018 8:22 AM Notification Sent [20]   David [name] [[id number]] 04/16/2018 12:29 PM Notification Sent [34

## 2018-09-17 ENCOUNTER — Encounter (INDEPENDENT_AMBULATORY_CARE_PROVIDER_SITE_OTHER): Payer: Self-pay | Admitting: Orthopaedic Surgery

## 2018-09-17 ENCOUNTER — Ambulatory Visit (INDEPENDENT_AMBULATORY_CARE_PROVIDER_SITE_OTHER): Payer: PRIVATE HEALTH INSURANCE

## 2018-09-17 ENCOUNTER — Ambulatory Visit (INDEPENDENT_AMBULATORY_CARE_PROVIDER_SITE_OTHER): Payer: Self-pay

## 2018-09-17 ENCOUNTER — Other Ambulatory Visit: Payer: Self-pay

## 2018-09-17 ENCOUNTER — Ambulatory Visit (INDEPENDENT_AMBULATORY_CARE_PROVIDER_SITE_OTHER): Payer: PRIVATE HEALTH INSURANCE | Admitting: Orthopaedic Surgery

## 2018-09-17 VITALS — BP 130/92 | HR 76 | Ht 66.0 in | Wt 320.0 lb

## 2018-09-17 DIAGNOSIS — G8929 Other chronic pain: Secondary | ICD-10-CM

## 2018-09-17 DIAGNOSIS — M545 Low back pain, unspecified: Secondary | ICD-10-CM

## 2018-09-17 DIAGNOSIS — M24662 Ankylosis, left knee: Secondary | ICD-10-CM

## 2018-09-17 DIAGNOSIS — M25562 Pain in left knee: Secondary | ICD-10-CM

## 2018-09-17 DIAGNOSIS — I10 Essential (primary) hypertension: Secondary | ICD-10-CM

## 2018-09-17 DIAGNOSIS — M109 Gout, unspecified: Secondary | ICD-10-CM

## 2018-09-17 DIAGNOSIS — M25762 Osteophyte, left knee: Secondary | ICD-10-CM

## 2018-09-17 DIAGNOSIS — M1990 Unspecified osteoarthritis, unspecified site: Secondary | ICD-10-CM

## 2018-09-17 NOTE — Progress Notes (Signed)
Office Visit Note   Patient: David [name]           Date of Birth: 1966-02-18           MRN: [id number] Visit Date: 09/17/2018              Requested by: Lovey Newcomer, PA [address] [place], [place] [address] PCP: Lovey Newcomer, [place]   Assessment & Plan: Visit Diagnoses:  1. Chronic pain of left knee   2. Chronic left-sided low back pain, unspecified whether sciatica present     Plan: Persistent flexion contracture left knee after arthroscopy in 2017 with a lateral release.  Had a very unusual reaction postoperatively maintaining his knee in a 90 degrees flexion position.  Despite aggressive physical therapy and manipulation and cortisone injections he still lacks full extension.  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 He is experiencing some issues with his back and I wonder if that is at all contributing to the problem with his leg.  I would like to order an MRI scan.  Do not think that further therapy or injections at this point would help with his knee flexion contracture.  He has considerable patellar crepitation and patellofemoral arthritis.  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  I am not sure there is any surgery at this point that will make much of a difference.  I would be concerned about repeat arthroscopy for debridement of the patella and the medial lateral compartment with a reaction that he had post knee arthroscopy in 2017.  At some point he may be a candidate for knee replacement which would potentially allow him to straighten his knee.  Certainly I would not suggest that at this point.  Follow-Up Instructions: No follow-ups on file.   Orders:  Orders Placed This Encounter  Procedures  . XR KNEE 3 VIEW LEFT  . XR Lumbar Spine 2-3 Views  . XR Pelvis 1-2 Views   No orders of the defined types were placed in this encounter.     Procedures: No procedures  performed   Clinical Data: No additional findings.   Subjective: Chief Complaint  Patient presents with  . Left Knee - Pain  Patient presents today for left knee pain. He is having difficulty getting in and out of the truck. He has a history of gout. His left hip is now having pain in his hip. He has tried therapy, cortisone injections, prednisone, and gout meds. 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 He has a history of knee arthroscopy last year. [name]  underwent a left knee arthroscopy in 2017 with debridement of all 3 compartments and a lateral patellar release.  Had a very unusual reaction postoperatively with a fixed flexion position of his knee.  Despite physical therapy he continued to have a flexion contracture.  I returned him to the operating room at approximately 6 weeks for a closed manipulation.  Over time he continues to have a problem with his knee.  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  He did seek another opinion at [facility] about a year ago and also at [facility]. there was a suggestion that he might be a candidate for repeat knee arthroscopy with debridement of "scar  tissue and manipulation.  He is not having much pain in his knee at present but notes that he is unable to fully extend.  Has had some back problems and occasional lateral left hip pain.  He walks with a cane.  BMI is 51.  He is not diabetic but occasionally feels some tingling and burning in his feet.  Has history of gout but no acute joint problems presently  HPI  Review of Systems   Objective: Vital Signs: BP (!) 130/92   Pulse 76   Ht 5\' 6"  (1.676 m)   Wt (!) 320 lb (145.2 kg)   BMI 51.65 kg/m   Physical Exam Constitutional:      Appearance: He is well-developed.  Eyes:     Pupils: Pupils are equal, round, and reactive to light.  Pulmonary:     Effort: Pulmonary effort is normal.  Skin:    General:  Skin is warm and dry.  Neurological:     Mental Status: He is alert and oriented to person, place, and time.  Psychiatric:        Behavior: Behavior normal.     Ortho Exam wake alert and oriented x3.  Comfortable sitting.  Left knee had considerable patellar crepitation.  No particular pain.  The left knee was not hot red warm or swollen.  No effusion.  No instability.  Lacked 18 degrees of full knee extension using the goniometer.  Flexed over 105 degrees.  No calf pain.  No popliteal pain.  Motor exam intact distally.  Has painless range of motion of left hip.  Did have some pain over the iliac crest and in the left paralumbar region to percussion.  Straight leg raise negative.  Specialty Comments:  No specialty comments available.  Imaging: No results found.   PMFS History: Patient Active Problem List   Diagnosis Date Noted  . Complication of anesthesia 03/11/2018  . Encounter for screening colonoscopy 03/11/2018  . FH: colon polyps 03/11/2018  . Arthrofibrosis of knee joint, left 09/18/2016   Past Medical History:  Diagnosis Date  . Aneurysm of aorta (HCC)   . Arthritis   . Complication of anesthesia    hard to wake up after 2012 carpel tunnel, knee arthroscopy. did ok after 2018 knee surgery  . Gout   . Hypertension   . Sleep apnea     Family History  Problem Relation Age of Onset  . Prostate cancer Paternal Uncle        multiple  . Colon polyps Father 44  . Colon cancer Neg Hx     Past Surgical History:  Procedure Laterality Date  . CARPAL TUNNEL RELEASE Right   . COLONOSCOPY WITH PROPOFOL N/A 04/03/2018   Procedure: COLONOSCOPY WITH PROPOFOL;  Surgeon: Corbin Ade, MD;  Location: [facility];  Service: Endoscopy;  Laterality: N/A;  1:00pm  . EYE SURGERY Left    d/t cornea laceration  . KNEE ARTHROSCOPY Left   . KNEE CLOSED REDUCTION Left 09/18/2016   Procedure: LEFT CLOSED MANIPULATION KNEE;  Surgeon: Valeria Batman, MD;  Location: [facility] OR;  Service:  Orthopedics;  Laterality: Left;   Social History   Occupational History  . Not on file  Tobacco Use  . Smoking status: Never Smoker  . Smokeless tobacco: Never Used  Substance and Sexual Activity  . Alcohol use: No  . Drug use: No  . Sexual activity: Yes

## 2018-09-17 NOTE — Addendum Note (Signed)
Addended by: Wendi Maya on: 09/17/2018 03:48 PM   Modules accepted: Orders

## 2018-09-24 ENCOUNTER — Ambulatory Visit (INDEPENDENT_AMBULATORY_CARE_PROVIDER_SITE_OTHER): Payer: PRIVATE HEALTH INSURANCE | Admitting: Orthopaedic Surgery

## 2018-09-30 ENCOUNTER — Telehealth: Payer: Self-pay | Admitting: *Deleted

## 2018-09-30 NOTE — Telephone Encounter (Signed)
Pt contacted per Dr Wyline Mood. History reviewed. No symptoms to suggest any unstable cardiac conditions. Based on discussion, with current pandemic situation, we have postponed 10/02/28 appointment until June 2020. If symptoms change, pt has been instructed to contact our office

## 2018-10-03 ENCOUNTER — Ambulatory Visit: Payer: BLUE CROSS/BLUE SHIELD | Admitting: Cardiology

## 2018-10-16 ENCOUNTER — Encounter (INDEPENDENT_AMBULATORY_CARE_PROVIDER_SITE_OTHER): Payer: Self-pay | Admitting: Orthopaedic Surgery

## 2018-12-09 ENCOUNTER — Ambulatory Visit: Payer: BLUE CROSS/BLUE SHIELD | Admitting: Cardiology

## 2019-01-09 ENCOUNTER — Telehealth: Payer: Self-pay | Admitting: Orthopaedic Surgery

## 2019-01-09 NOTE — Telephone Encounter (Signed)
09/17/18 ov note faxed to our [facility] [phone]

## 2019-02-06 ENCOUNTER — Telehealth: Payer: Self-pay | Admitting: Cardiology

## 2019-02-06 NOTE — Telephone Encounter (Signed)
Virtual Visit Pre-Appointment Phone Call  "(Name), I am calling you today to discuss your upcoming appointment. We are currently trying to limit exposure to the virus that causes COVID-19 by seeing patients at home rather than in the office."  1. "What is the BEST phone number to call the day of the visit?" - include this in appointment notes  2. Do you have or have access to (through a family member/friend) a smartphone with video capability that we can use for your visit?" a. If yes - list this number in appt notes as cell (if different from BEST phone #) and list the appointment type as a VIDEO visit in appointment notes b. If no - list the appointment type as a PHONE visit in appointment notes  3. Confirm consent - "In the setting of the current Covid19 crisis, you are scheduled for a (phone or video) visit with your provider on (date) at (time).  Just as we do with many in-office visits, in order for you to participate in this visit, we must obtain consent.  If you'd like, I can send this to your mychart (if signed up) or email for you to review.  Otherwise, I can obtain your verbal consent now.  All virtual visits are billed to your insurance company just like a normal visit would be.  By agreeing to a virtual visit, we'd like you to understand that the technology does not allow for your provider to perform an examination, and thus may limit your provider's ability to fully assess your condition. If your provider identifies any concerns that need to be evaluated in person, we will make arrangements to do so.  Finally, though the technology is pretty good, we cannot assure that it will always work on either your or our end, and in the setting of a video visit, we may have to convert it to a phone-only visit.  In either situation, we cannot ensure that we have a secure connection.  Are you willing to proceed?" STAFF: Did the patient verbally acknowledge consent to telehealth visit? Document  YES/NO here: yes  4. Advise patient to be prepared - "Two hours prior to your appointment, go ahead and check your blood pressure, pulse, oxygen saturation, and your weight (if you have the equipment to check those) and write them all down. When your visit starts, your provider will ask you for this information. If you have an Apple Watch or Kardia device, please plan to have heart rate information ready on the day of your appointment. Please have a pen and paper handy nearby the day of the visit as well."  5. Give patient instructions for MyChart download to smartphone OR Doximity/Doxy.me as below if video visit (depending on what platform provider is using)  6. Inform patient they will receive a phone call 15 minutes prior to their appointment time (may be from unknown caller ID) so they should be prepared to answer    TELEPHONE CALL NOTE  David [name] has been deemed a candidate for a follow-up tele-health visit to limit community exposure during the Covid-19 pandemic. I spoke with the patient via phone to ensure availability of phone/video source, confirm preferred email & phone number, and discuss instructions and expectations.  I reminded David [name] to be prepared with any vital sign and/or heart rhythm information that could potentially be obtained via home monitoring, at the time of his visit. I reminded David [name] to expect a phone call prior to  his visit.  David [name] 02/06/2019 2:28 PM   INSTRUCTIONS FOR DOWNLOADING THE MYCHART APP TO SMARTPHONE  - The patient must first make sure to have activated MyChart and know their login information - If Apple, go to [employer] and type in MyChart in the search bar and download the app. If Android, ask patient to go to [employer] and type in [place] in the search bar and download the app. The app is free but as with any other app downloads, their phone may require them to verify saved payment information or  Apple/Android password.  - The patient will need to then log into the app with their MyChart username and password, and select [facility] as their healthcare provider to link the account. When it is time for your visit, go to the MyChart app, find appointments, and click Begin Video Visit. Be sure to Select Allow for your device to access the Microphone and Camera for your visit. You will then be connected, and your provider will be with you shortly.  **If they have any issues connecting, or need assistance please contact MyChart service desk [phone])**  **If using a computer, in order to ensure the best quality for their visit they will need to use either of the following Internet Browsers: [employer], or Google Chrome**  IF USING DOXIMITY or DOXY.ME - The patient will receive a link just prior to their visit by text.     FULL LENGTH CONSENT FOR TELE-HEALTH VISIT   I hereby voluntarily request, consent and authorize [facility] and its employed or contracted physicians, physician assistants, nurse practitioners or other licensed health care professionals (the Practitioner), to provide me with telemedicine health care services (the Services") as deemed necessary by the treating Practitioner. I acknowledge and consent to receive the Services by the Practitioner via telemedicine. I understand that the telemedicine visit will involve communicating with the Practitioner through live audiovisual communication technology and the disclosure of certain medical information by electronic transmission. I acknowledge that I have been given the opportunity to request an in-person assessment or other available alternative prior to the telemedicine visit and am voluntarily participating in the telemedicine visit.  I understand that I have the right to withhold or withdraw my consent to the use of telemedicine in the course of my care at any time, without affecting my right to future care  or treatment, and that the Practitioner or I may terminate the telemedicine visit at any time. I understand that I have the right to inspect all information obtained and/or recorded in the course of the telemedicine visit and may receive copies of available information for a reasonable fee.  I understand that some of the potential risks of receiving the Services via telemedicine include:   Delay or interruption in medical evaluation due to technological equipment failure or disruption;  Information transmitted may not be sufficient (e.g. poor resolution of images) to allow for appropriate medical decision making by the Practitioner; and/or   In rare instances, security protocols could fail, causing a breach of personal health information.  Furthermore, I acknowledge that it is my responsibility to provide information about my medical history, conditions and care that is complete and accurate to the best of my ability. I acknowledge that Practitioner's advice, recommendations, and/or decision may be based on factors not within their control, such as incomplete or inaccurate data provided by me or distortions of diagnostic images or specimens that may result from electronic transmissions. I  understand that the practice of medicine is not an exact science and that Practitioner makes no warranties or guarantees regarding treatment outcomes. I acknowledge that I will receive a copy of this consent concurrently upon execution via email to the email address I last provided but may also request a printed copy by calling the office of [facility].    I understand that my insurance will be billed for this visit.   I have read or had this consent read to me.  I understand the contents of this consent, which adequately explains the benefits and risks of the Services being provided via telemedicine.   I have been provided ample opportunity to ask questions regarding this consent and the Services and have had  my questions answered to my satisfaction.  I give my informed consent for the services to be provided through the use of telemedicine in my medical care  By participating in this telemedicine visit I agree to the above.

## 2019-02-11 ENCOUNTER — Encounter: Payer: Self-pay | Admitting: Cardiology

## 2019-02-11 ENCOUNTER — Telehealth: Payer: Self-pay | Admitting: *Deleted

## 2019-02-11 ENCOUNTER — Telehealth (INDEPENDENT_AMBULATORY_CARE_PROVIDER_SITE_OTHER): Payer: PRIVATE HEALTH INSURANCE | Admitting: Cardiology

## 2019-02-11 VITALS — BP 106/69 | Ht 66.0 in | Wt 312.0 lb

## 2019-02-11 DIAGNOSIS — I712 Thoracic aortic aneurysm, without rupture, unspecified: Secondary | ICD-10-CM

## 2019-02-11 DIAGNOSIS — I10 Essential (primary) hypertension: Secondary | ICD-10-CM

## 2019-02-11 NOTE — Progress Notes (Signed)
Virtual Visit via Telephone Note   This visit type was conducted due to national recommendations for restrictions regarding the COVID-19 Pandemic (e.g. social distancing) in an effort to limit this patient's exposure and mitigate transmission in our community.  Due to his co-morbid illnesses, this patient is at least at moderate risk for complications without adequate follow up.  This format is felt to be most appropriate for this patient at this time.  The patient did not have access to video technology/had technical difficulties with video requiring transitioning to audio format only (telephone).  All issues noted in this document were discussed and addressed.  No physical exam could be performed with this format.  Please refer to the patient's chart for his  consent to telehealth for [facility].   Date:  02/11/2019   ID:  David [name], DOB [birth year], MRN [id number]  Patient Location: Home Provider Location: Office  PCP:  Lavella Lemons, PA  Cardiologist:  Carlyle Dolly, MD  Electrophysiologist:  None   Evaluation Performed:  Follow-Up Visit  Chief Complaint:  Follow up  History of Present Illness:    David [name] is a 53 y.o. male seen today for follow up of the following medical problems.    1. Thoracic aortic aneurysm - CT scan 2011 4.5 cm ascending aneurysm according to prior clinic notes.  - f/u echo meausred at 4.7 cm.   - no recent symptoms.   2. HTN - he is compliant with meds     SH: not working, currently on  disability.   The patient does not have symptoms concerning for COVID-19 infection (fever, chills, cough, or new shortness of breath).    Past Medical History:  Diagnosis Date  . Aneurysm of aorta (HCC)   . Arthritis   . Complication of anesthesia    hard to wake up after 2012 carpel tunnel, knee arthroscopy. did ok after 2018 knee surgery  . Gout   . Hypertension   . Sleep apnea    Past Surgical History:  Procedure Laterality  Date  . CARPAL TUNNEL RELEASE Right   . COLONOSCOPY WITH PROPOFOL N/A 04/03/2018   Procedure: COLONOSCOPY WITH PROPOFOL;  Surgeon: Daneil Dolin, MD;  Location[facility];  Service: Endoscopy;  Laterality: N/A;  1:00pm  . EYE SURGERY Left    d/t cornea laceration  . KNEE ARTHROSCOPY Left   . KNEE CLOSED REDUCTION Left 09/18/2016   Procedure: LEFT CLOSED MANIPULATION KNEE;  Surgeon: Garald Balding, MD;  Location: [place];  Service: Orthopedics;  Laterality: Left;     No outpatient medications have been marked as taking for the 02/11/19 encounter (Appointment) with Arnoldo Lenis, MD.     Allergies:   Other   Social History   Tobacco Use  . Smoking status: Never Smoker  . Smokeless tobacco: Never Used  Substance Use Topics  . Alcohol use: No  . Drug use: No     Family Hx: The patient's family history includes Colon polyps (age of onset: 85) in his father; Prostate cancer in his paternal uncle. There is no history of Colon cancer.  ROS:   Please see the history of present illness.     All other systems reviewed and are negative.   Prior CV studies:   The following studies were reviewed today:   Labs/Other Tests and Data Reviewed:    EKG:  No ECG reviewed.  Recent Labs: 04/01/2018: BUN 14; Creatinine, Ser 1.41; Hemoglobin 14.3; Platelets 192;  Potassium 3.3; Sodium 139   Recent Lipid Panel No results found for: CHOL, TRIG, HDL, CHOLHDL, LDLCALC, LDLDIRECT  Wt Readings from Last 3 Encounters:  09/17/18 (!) 320 lb (145.2 kg)  04/01/18 (!) 320 lb 12.8 oz (145.5 kg)  03/11/18 (!) 318 lb 3.2 oz (144.3 kg)     Objective:    Vital Signs:   Today's Vitals   02/11/19 0844  BP: 106/69  Weight: (!) 312 lb (141.5 kg)  Height: 5\' 6"  (1.676 m)   Body mass index is 50.36 kg/m. Normal affect. Normal speech pattern and tone. Comfortable, no apparent distress. NO audible signs of SOB or wheezing.   ASSESSMENT & PLAN:    1. Aortic aneurysm - check kidney  function, pending results plan for repeat CTA - continue beta locker  2. HTN - at goal, continue current meds  COVID-19 Education: The signs and symptoms of COVID-19 were discussed with the patient and how to seek care for testing (follow up with PCP or arrange E-visit).  The importance of social distancing was discussed today.  Time:   Today, I have spent 13 minutes with the patient with telehealth technology discussing the above problems.     Medication Adjustments/Labs and Tests Ordered: Current medicines are reviewed at length with the patient today.  Concerns regarding medicines are outlined above.   Tests Ordered: No orders of the defined types were placed in this encounter.   Medication Changes: No orders of the defined types were placed in this encounter.   Follow Up:  In Person in 1 year(s)  Signed, Dina RichBranch, Shareeka Yim, MD  02/11/2019 8:19 AM    [facility]

## 2019-02-11 NOTE — Telephone Encounter (Signed)
Advised that lab order has been faxed to [place] he will go in the morning to have it done.

## 2019-02-11 NOTE — Patient Instructions (Addendum)
Medication Instructions:   Your physician recommends that you continue on your current medications as directed. Please refer to the Current Medication list given to you today.  Labwork:  Your physician recommends that you return for lab work in: TODAY if possible to check your BMET-pending CTA thoracic aorta for aortic aneurysm  Testing/Procedures:  NONE  Follow-Up:  Your physician recommends that you schedule a follow-up appointment in: 1 year. You will receive a reminder letter in the mail in about 10 months reminding you to call and schedule your appointment. If you don't receive this letter, please contact our office.  Any Other Special Instructions Will Be Listed Below (If Applicable).  If you need a refill on your cardiac medications before your next appointment, please call your pharmacy.

## 2019-02-19 ENCOUNTER — Telehealth: Payer: Self-pay | Admitting: *Deleted

## 2019-02-19 ENCOUNTER — Telehealth: Payer: Self-pay | Admitting: Cardiology

## 2019-02-19 ENCOUNTER — Encounter: Payer: Self-pay | Admitting: *Deleted

## 2019-02-19 DIAGNOSIS — I712 Thoracic aortic aneurysm, without rupture, unspecified: Secondary | ICD-10-CM

## 2019-02-19 NOTE — Telephone Encounter (Signed)
°  Precert needed for: CTA   Location: [facility]    Date: Sept 1, 2020

## 2019-02-19 NOTE — Telephone Encounter (Signed)
-----   Message from Arnoldo Lenis, MD sent at 02/18/2019  8:58 AM EDT ----- Received labs, kidney function mildly decreased but stable and strong enough for the CTA he needs. Please order a CTA thoracic aorta for aneurysm.   Zandra Abts MD

## 2019-02-19 NOTE — Telephone Encounter (Signed)
LM to return call.

## 2019-02-19 NOTE — Telephone Encounter (Signed)
Pt agreeable to CTA orders place and will forward to schedulers

## 2019-03-03 ENCOUNTER — Other Ambulatory Visit: Payer: Self-pay

## 2019-03-03 ENCOUNTER — Ambulatory Visit (HOSPITAL_COMMUNITY)
Admission: RE | Admit: 2019-03-03 | Discharge: 2019-03-03 | Disposition: A | Discharge status: Home / Self Care | Payer: BC Managed Care – PPO | Source: Ambulatory Visit | Attending: Cardiology | Admitting: Cardiology

## 2019-03-03 DIAGNOSIS — I712 Thoracic aortic aneurysm, without rupture, unspecified: Secondary | ICD-10-CM

## 2019-03-03 LAB — POCT I-STAT CREATININE: Creatinine, Ser: 1.4 mg/dL — ABNORMAL HIGH (ref 0.61–1.24)

## 2019-03-03 MED ORDER — IOHEXOL 350 MG/ML SOLN
100.0000 mL | Freq: Once | INTRAVENOUS | Status: AC | PRN
  Administered 2019-03-03: 100 mL via INTRAVENOUS

## 2019-03-11 ENCOUNTER — Telehealth: Payer: Self-pay | Admitting: *Deleted

## 2019-03-11 NOTE — Telephone Encounter (Signed)
Pt voiced understanding - routed to pcp  

## 2019-03-11 NOTE — Telephone Encounter (Signed)
-----   Message from Arnoldo Lenis, MD sent at 03/10/2019 11:16 AM EDT ----- Aneurysm essentially stable and remains mild, we will continue to monitor  Zandra Abts MD

## 2019-09-10 ENCOUNTER — Ambulatory Visit: Payer: BC Managed Care – PPO | Attending: Internal Medicine

## 2019-09-10 DIAGNOSIS — Z23 Encounter for immunization: Secondary | ICD-10-CM

## 2019-09-10 NOTE — Progress Notes (Signed)
   Covid-19 Vaccination Clinic  Name:  David [name]    MRN: [id number] DOB: [birth year]  09/10/2019  [name] was observed post Covid-19 immunization for 15 minutes without incident. He was provided with Vaccine Information Sheet and instruction to access the V-Safe system.   [name] was instructed to call 911 with any severe reactions post vaccine: Marland Kitchen Difficulty breathing  . Swelling of face and throat  . A fast heartbeat  . A bad rash all over body  . Dizziness and weakness   Immunizations Administered    Name Date Dose VIS Date Route   Moderna COVID-19 Vaccine 09/10/2019 11:48 AM 0.5 mL 06/02/2019 Intramuscular   Manufacturer: Moderna   Lot: [id number]   NDC: 33612-244-97

## 2019-10-13 ENCOUNTER — Ambulatory Visit: Payer: BC Managed Care – PPO | Attending: Internal Medicine

## 2019-10-13 DIAGNOSIS — Z23 Encounter for immunization: Secondary | ICD-10-CM

## 2019-10-13 NOTE — Progress Notes (Signed)
   Covid-19 Vaccination Clinic  Name:  David [name]    MRN: [id number] DOB: 1965-11-03  10/13/2019  Mr. David [name] was observed post Covid-19 immunization for 15 minutes without incident. He was provided with Vaccine Information Sheet and instruction to access the V-Safe system.   Mr. David [name] was instructed to call 911 with any severe reactions post vaccine: Marland Kitchen Difficulty breathing  . Swelling of face and throat  . A fast heartbeat  . A bad rash all over body  . Dizziness and weakness   Immunizations Administered    Name Date Dose VIS Date Route   Moderna COVID-19 Vaccine 10/13/2019 11:41 AM 0.5 mL 06/02/2019 Intramuscular   Manufacturer: Moderna   Lot: [id number]   NDC: 29191-660-60

## 2019-11-12 ENCOUNTER — Other Ambulatory Visit: Payer: Self-pay | Admitting: Orthopaedic Surgery

## 2019-11-12 DIAGNOSIS — M545 Low back pain, unspecified: Secondary | ICD-10-CM

## 2019-11-12 DIAGNOSIS — G8929 Other chronic pain: Secondary | ICD-10-CM

## 2019-12-31 IMAGING — CT CT ANGIO CHEST
2 of 6 series · 16 of 36 positions shown · IV contrast (omnipaque)
Comparison: CT 12/12/2014

CLINICAL DATA: Follow-up node ascending aortic aneurysm

EXAM:
CT ANGIOGRAPHY CHEST WITH CONTRAST
TECHNIQUE: Multidetector CT imaging of the chest was performed using the
standard protocol during bolus administration of intravenous
contrast. Multiplanar CT image reconstructions and MIPs were
obtained to evaluate the vascular anatomy.
CONTRAST:  100mL OMNIPAQUE IOHEXOL 350 MG/ML SOLN

[Series 7: lungs · axial · 0.69mm/px · z∈[+1200,+1484]mm · 15 of 164 slices shown]
[im 11/164  lung]
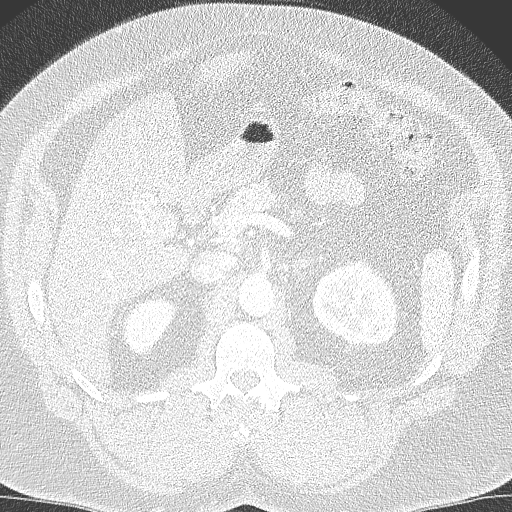
[im 21/164  mediastinal]
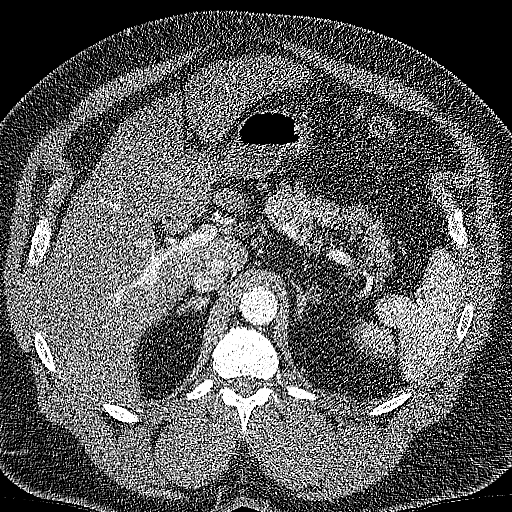
[im 31/164  lung]
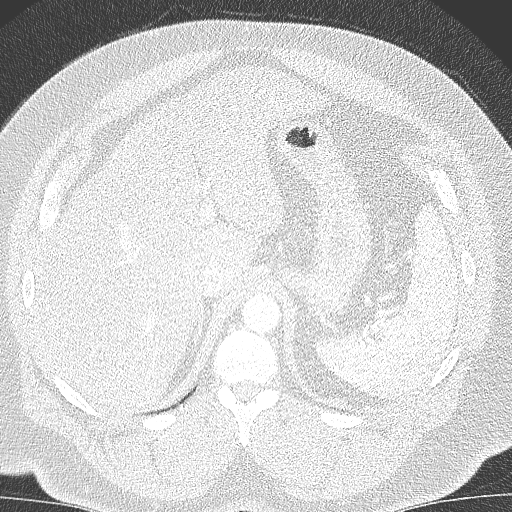
[im 41/164  mediastinal]
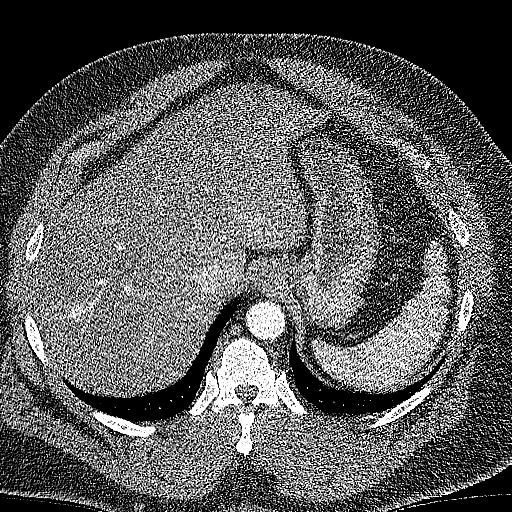
[im 51/164  lung]
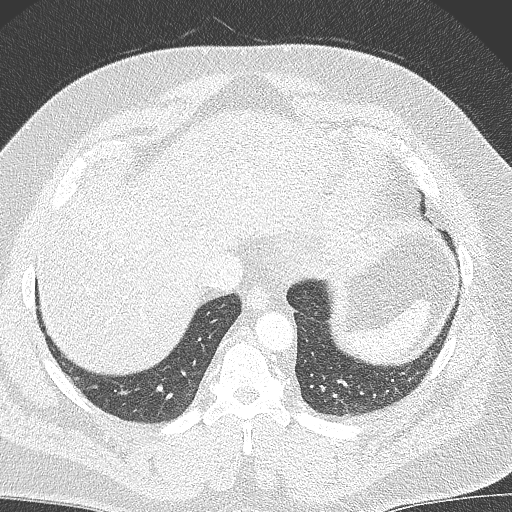
[im 62/164  mediastinal]
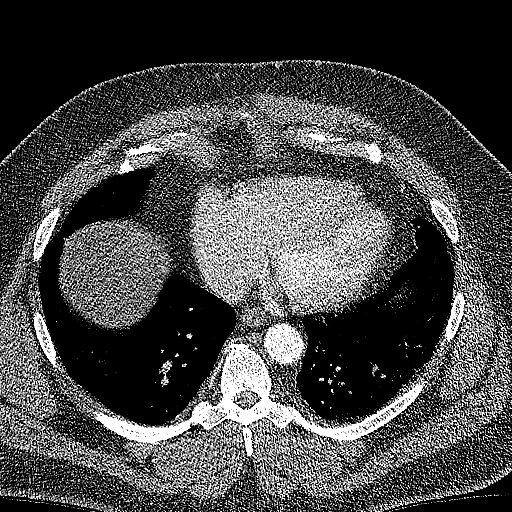
[im 72/164  lung]
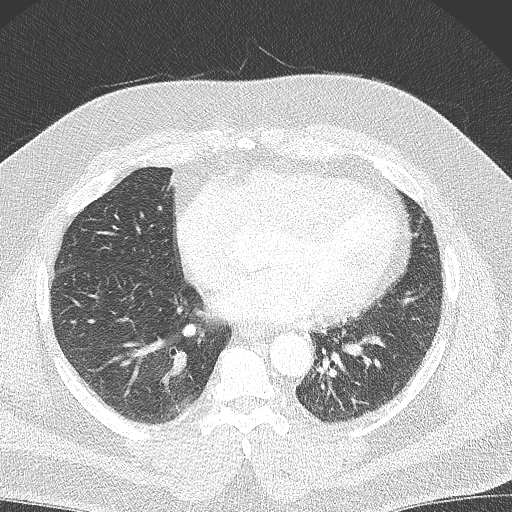
[im 82/164  mediastinal]
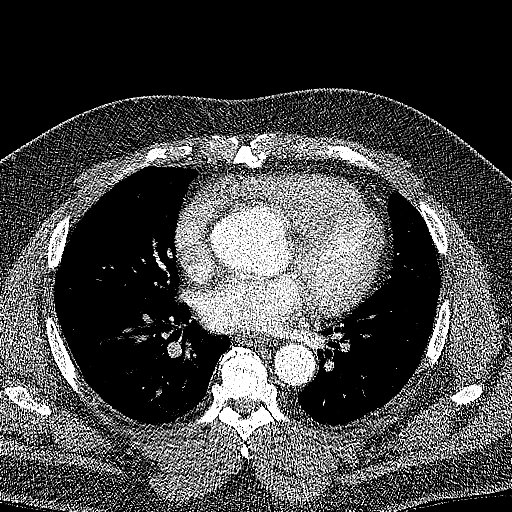
[im 92/164  lung]
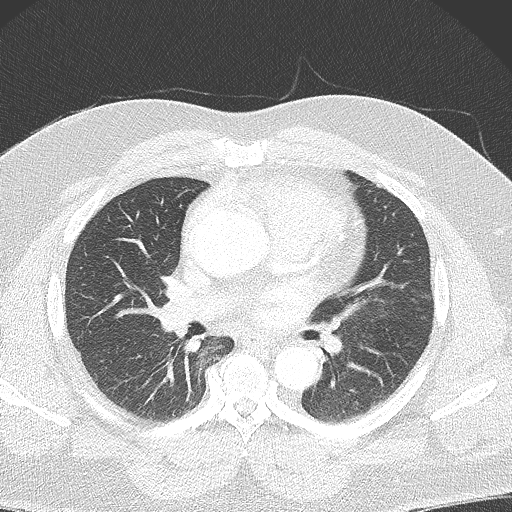
[im 102/164  mediastinal]
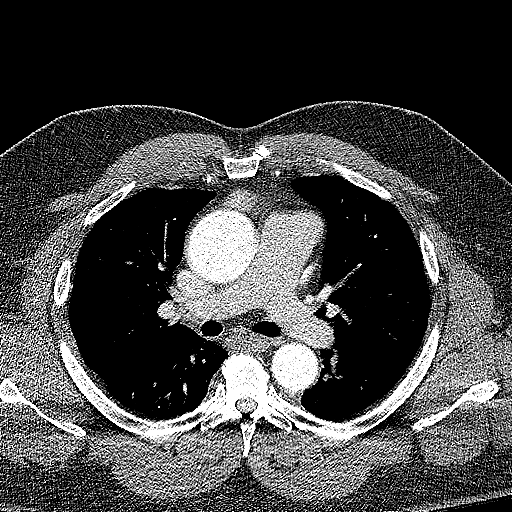
[im 113/164  lung]
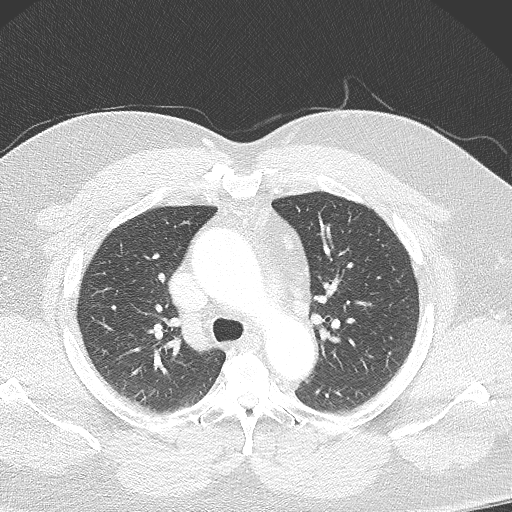
[im 123/164  mediastinal]
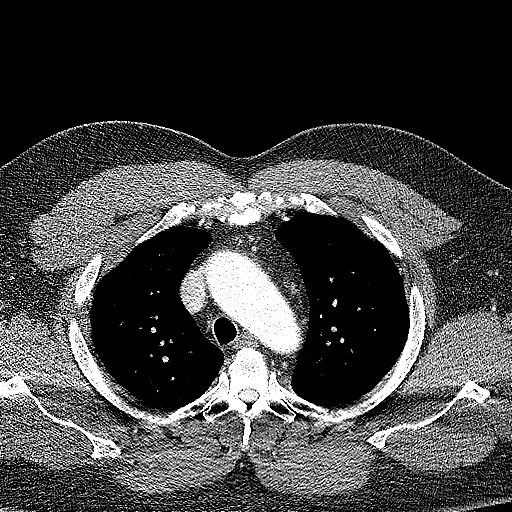
[im 133/164  lung]
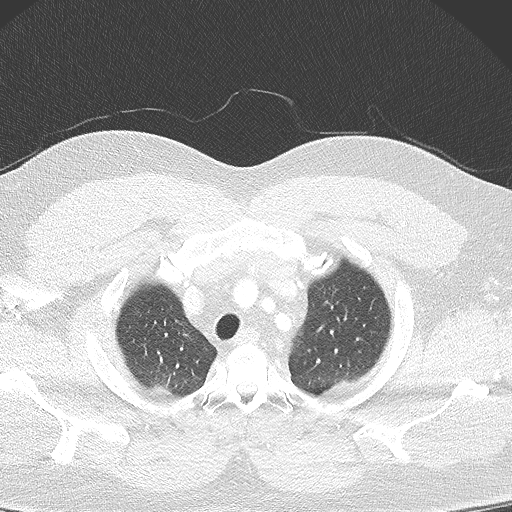
[im 143/164  mediastinal]
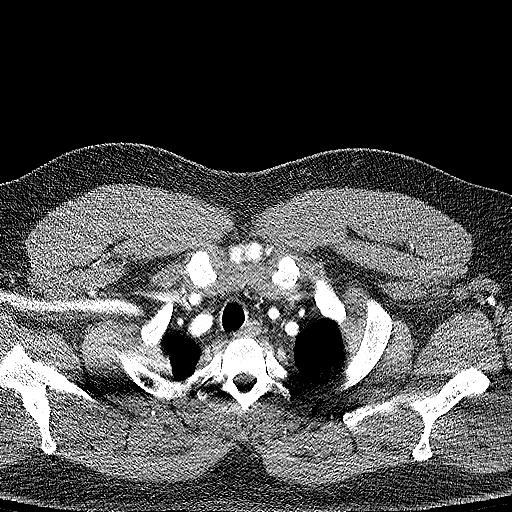
[im 153/164  lung]
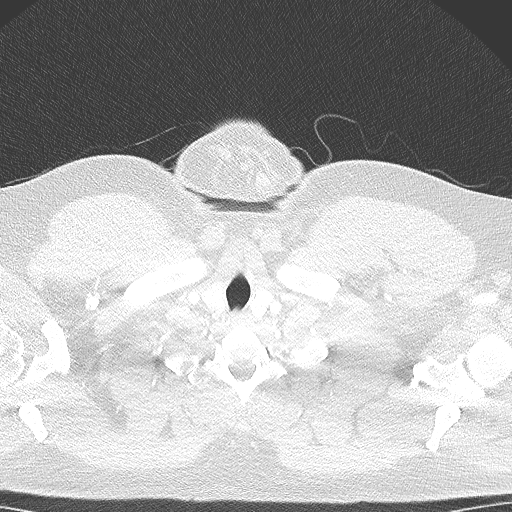

[Series 8: cor soft · coronal · 0.65mm/px · 1 of 149 slices shown]
[im 75/149  mediastinal]
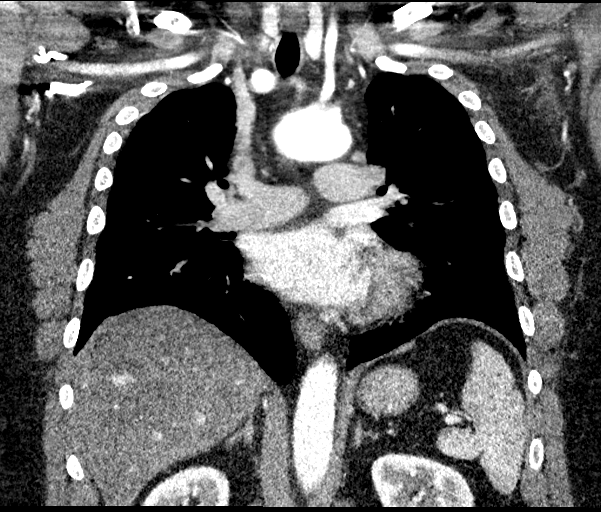

[16 of 36 positions shown; findings below may reference images not displayed]

FINDINGS: Cardiovascular: Ascending thoracic aorta measures 45 mm in sagittal
projection (image 91/9 compared to 44 mm on CT 12/12/2014. In
coronal projection, aneurysm measures 43 mm (image 53/8) compared to
42 mm. Coronal projection appears to be most accurate as aorta is
ectatic.

Great vessels are normal with typical 3 branch anatomy. No
calcification of the aortic arch.

Mediastinum/Nodes: No axillary supraclavicular adenopathy. No
mediastinal hilar adenopathy. No pericardial effusion

Lungs/Pleura: No suspicious pulmonary nodularity. Airways normal.
Mild atelectasis in the azygoesophageal recess rated to
osteophytosis.

Upper Abdomen: Limited view of the liver, kidneys, pancreas are
unremarkable. Normal adrenal glands. Gallstones noted.

Musculoskeletal: No aggressive osseous lesion. Sclerotic lesion in
the T3 vertebral body unchanged from prior.

Review of the MIP images confirms the above findings.
IMPRESSION: 1. Aneurysmal dilatation ascending aorta measures 43 mm slightly
increased from 42 mm on CT 2089. Recommend annual imaging followup
by CTA or MRA. This recommendation follows 0545
ACCF/AHA/AATS/ACR/ASA/SCA/DEL ABDUL/EINO-JUHANI/OXHOLM/ENKEL Guidelines for the
Diagnosis and Management of Patients with Thoracic Aortic Disease.
Circulation. 0545; 121: E266-e369. Aortic aneurysm NOS
([id number]). Consider thoracic surgery consultation if not already
obtained.
2. Cholelithiasis.

## 2020-03-31 ENCOUNTER — Ambulatory Visit (INDEPENDENT_AMBULATORY_CARE_PROVIDER_SITE_OTHER): Payer: 59 | Admitting: Urology

## 2020-03-31 ENCOUNTER — Other Ambulatory Visit: Payer: Self-pay

## 2020-03-31 ENCOUNTER — Encounter: Payer: Self-pay | Admitting: Urology

## 2020-03-31 VITALS — BP 126/86 | HR 73 | Temp 98.2°F | Ht 66.0 in | Wt 320.0 lb

## 2020-03-31 DIAGNOSIS — N401 Enlarged prostate with lower urinary tract symptoms: Secondary | ICD-10-CM

## 2020-03-31 DIAGNOSIS — N138 Other obstructive and reflux uropathy: Secondary | ICD-10-CM | POA: Insufficient documentation

## 2020-03-31 DIAGNOSIS — K4090 Unilateral inguinal hernia, without obstruction or gangrene, not specified as recurrent: Secondary | ICD-10-CM

## 2020-03-31 DIAGNOSIS — R351 Nocturia: Secondary | ICD-10-CM | POA: Insufficient documentation

## 2020-03-31 LAB — BLADDER SCAN AMB NON-IMAGING: Scan Result: 70

## 2020-03-31 MED ORDER — CYCLOBENZAPRINE HCL 5 MG PO TABS: 5.0000 mg | ORAL_TABLET | Freq: Three times a day (TID) | ORAL | 0 refills | Status: AC | PRN

## 2020-03-31 NOTE — Progress Notes (Signed)
Urological Symptom Review ° °Patient is experiencing the following symptoms: °Frequent urination °Get up at night to urinate °Leakage of urine ° ° °Review of Systems ° °Gastrointestinal (upper)  : °Negative for upper GI symptoms ° °Gastrointestinal (lower) : °Negative for lower GI symptoms ° °Constitutional : °Negative for symptoms ° °Skin: °Negative for skin symptoms ° °Eyes: °Negative for eye symptoms ° °Ear/Nose/Throat : °Negative for Ear/Nose/Throat symptoms ° °Hematologic/Lymphatic: °Negative for Hematologic/Lymphatic symptoms ° °Cardiovascular : °Negative for cardiovascular symptoms ° °Respiratory : °Negative for respiratory symptoms ° °Endocrine: °Negative for endocrine symptoms ° °Musculoskeletal: °Back pain °Joint pain ° °Neurological: °Negative for neurological symptoms ° °Psychologic: °Negative for psychiatric symptoms ° °

## 2020-03-31 NOTE — Progress Notes (Signed)
03/31/2020 11:56 AM   David [name] 05/07/1966 [id number]  Referring provider: Lovey Newcomer, PA [name] [place],  [place] [address]  right testicular pain, nocturia  HPI: [name] is a 54yo here for evaluation of right testicular pain and nocturia. For the past year he has intermittent right testicular pain that starts in his lower back and then radiates to his right testis. Pain is exacerbated by standing from a seated position. No other associated symptoms. NO other exacerbating/aleviaiting events. No pain with lifting. No injury to testicles. No hx of nephrolithiasis. He has nocturia 1-2x based on fluid comsumption. Stream good. He has intermittent urinary frequency and urgency   PMH: Past Medical History:  Diagnosis Date  . Aneurysm of aorta (HCC)   . Arthritis   . Complication of anesthesia    hard to wake up after 2012 carpel tunnel, knee arthroscopy. did ok after 2018 knee surgery  . Gout   . Hypertension   . Sleep apnea     Surgical History: Past Surgical History:  Procedure Laterality Date  . CARPAL TUNNEL RELEASE Right   . COLONOSCOPY WITH PROPOFOL N/A 04/03/2018   Procedure: COLONOSCOPY WITH PROPOFOL;  Surgeon: Corbin Ade, MD;  [facility];  Service: Endoscopy;  Laterality: N/A;  1:00pm  . EYE SURGERY Left    d/t cornea laceration  . KNEE ARTHROSCOPY Left   . KNEE CLOSED REDUCTION Left 09/18/2016   Procedure: LEFT CLOSED MANIPULATION KNEE;  Surgeon: Valeria Batman, MD;  [facility];  Service: Orthopedics;  Laterality: Left;    Home Medications:  Allergies as of 03/31/2020      Reactions   Bean Pod Extract Anaphylaxis   Throat swells,  Throat swells, SOB   Other Anaphylaxis   All Beans      Medication List       Accurate as of March 31, 2020 11:56 AM. If you have any questions, ask your nurse or doctor.        STOP taking these medications   metoprolol succinate 25 MG 24 hr tablet Commonly known as: Toprol  XL Stopped by: Wilkie Aye, MD   metoprolol succinate 50 MG 24 hr tablet Commonly known as: TOPROL-XL Stopped by: Wilkie Aye, MD     TAKE these medications   allopurinol 300 MG tablet Commonly known as: ZYLOPRIM Take 300 mg by mouth daily.   aspirin EC 81 MG tablet Take 81 mg by mouth daily.   chlorthalidone 25 MG tablet Commonly known as: HYGROTON Take 25 mg by mouth daily.   diclofenac sodium 1 % Gel Commonly known as: VOLTAREN Apply 1 application topically 3 (three) times daily as needed. What changed: Another medication with the same name was removed. Continue taking this medication, and follow the directions you see here. Changed by: Wilkie Aye, MD   lisinopril 40 MG tablet Commonly known as: ZESTRIL Take 40 mg by mouth daily.       Allergies:  Allergies  Allergen Reactions  . Bean Pod Extract Anaphylaxis    Throat swells,  Throat swells, SOB  . Other Anaphylaxis    All Beans    Family History: Family History  Problem Relation Age of Onset  . Prostate cancer Paternal Uncle        multiple  . Colon polyps Father 66  . Heart disease Father   . Colon cancer Neg Hx     Social History:  reports that he has never smoked. He has  never used smokeless tobacco. He reports that he does not drink alcohol and does not use drugs.  ROS: All other review of systems were reviewed and are negative except what is noted above in HPI  Physical Exam: BP 126/86   Pulse 73   Temp 98.2 F (36.8 C)   Ht 5\' 6"  (1.676 m)   Wt (!) 320 lb (145.2 kg)   BMI 51.65 kg/m   Constitutional:  Alert and oriented, No acute distress. HEENT: [place] AT, moist mucus membranes.  Trachea midline, no masses. Cardiovascular: No clubbing, cyanosis, or edema. Respiratory: Normal respiratory effort, no increased work of breathing. GI: Abdomen is soft, nontender, nondistended, no abdominal masses GU: No CVA tenderness. Circumcised phallus. No masses/lesions on penis, testis,  scrotum. Probable right inguinal hernia Lymph: No cervical or inguinal lymphadenopathy. Skin: No rashes, bruises or suspicious lesions. Neurologic: Grossly intact, no focal deficits, moving all 4 extremities. Psychiatric: Normal mood and affect.  Laboratory Data: Lab Results  Component Value Date   WBC 8.7 04/01/2018   HGB 14.3 04/01/2018   HCT 43.3 04/01/2018   MCV 83.0 04/01/2018   PLT 192 04/01/2018    Lab Results  Component Value Date   CREATININE 1.40 (H) 03/03/2019    No results found for: PSA  No results found for: TESTOSTERONE  No results found for: HGBA1C  Urinalysis No results found for: COLORURINE, APPEARANCEUR, LABSPEC, PHURINE, GLUCOSEU, HGBUR, BILIRUBINUR, KETONESUR, PROTEINUR, UROBILINOGEN, NITRITE, LEUKOCYTESUR  No results found for: LABMICR, WBCUA, RBCUA, LABEPIT, MUCUS, BACTERIA  Pertinent Imaging:  No results found for this or any previous visit.  No results found for this or any previous visit.  No results found for this or any previous visit.  No results found for this or any previous visit.  Results for orders placed during the hospital encounter of 02/02/08  04/03/08 Renal  Narrative Clinical Data: Chronic renal disease.  RENAL/URINARY TRACT ULTRASOUND  Technique:  Complete ultrasound examination of the urinary tract was performed including evaluation of the kidneys renal collecting systems and urinary bladder.  Comparison: None  Findings: Right kidney measures 11.6 cm and left kidney, 11.3 cm. Parenchymal echo texture is uniform.  No hydronephrosis.  Bladder is unremarkable.  IMPRESSION: No acute findings.  Provider: [place]  No results found for this or any previous visit.  No results found for this or any previous visit.  No results found for this or any previous visit.   Assessment & Plan:    1. Benign prostatic hyperplasia with urinary obstruction, nocturia -Patient is not bothered by his LUTS. We will continue  observation  2. Right testicular pain -Probably right inguinal hernia on exam today. Patient has central obesity and inguinal hernia exam is difficult.  Will refer him to Dr. Joesph Fillers due to concern for right inguinal hernia  3. Dorsalgia -flexeril prn   No follow-ups on file.  Lovell Sheehan, MD  [facility] [place]

## 2020-03-31 NOTE — Patient Instructions (Signed)
Hernia, Adult     A hernia happens when tissue inside your body pushes out through a weak spot in your belly muscles (abdominal wall). This makes a round lump (bulge). The lump may be:  In a scar from surgery that was done in your belly (incisional hernia).  Near your belly button (umbilical hernia).  In your groin (inguinal hernia). Your groin is the area where your leg meets your lower belly (abdomen). This kind of hernia could also be: ? In your scrotum, if you are male. ? In folds of skin around your vagina, if you are male.  In your upper thigh (femoral hernia).  Inside your belly (hiatal hernia). This happens when your stomach slides above the muscle between your belly and your chest (diaphragm). If your hernia is small and it does not cause pain, you may not need treatment. If your hernia is large or it causes pain, you may need surgery. Follow these instructions at home: Activity  Avoid stretching or overusing (straining) the muscles near your hernia. Straining can happen when you: ? Lift something heavy. ? Poop (have a bowel movement).  Do not lift anything that is heavier than 10 lb (4.5 kg), or the limit that you are told, until your doctor says that it is safe.  Use the strength of your legs when you lift something heavy. Do not use only your back muscles to lift. General instructions  Do these things if told by your doctor so you do not have trouble pooping (constipation): ? Drink enough fluid to keep your pee (urine) pale yellow. ? Eat foods that are high in fiber. These include fresh fruits and vegetables, whole grains, and beans. ? Limit foods that are high in fat and processed sugars. These include foods that are fried or sweet. ? Take medicine for trouble pooping.  When you cough, try to cough gently.  You may try to push your hernia in by very gently pressing on it when you are lying down. Do not try to force the bulge back in if it will not push in  easily.  If you are overweight, work with your doctor to lose weight safely.  Do not use any products that have nicotine or tobacco in them. These include cigarettes and e-cigarettes. If you need help quitting, ask your doctor.  If you will be having surgery (hernia repair), watch your hernia for changes in shape, size, or color. Tell your doctor if you see any changes.  Take over-the-counter and prescription medicines only as told by your doctor.  Keep all follow-up visits as told by your doctor. Contact a doctor if:  You get new pain, swelling, or redness near your hernia.  You poop fewer times in a week than normal.  You have trouble pooping.  You have poop (stool) that is more dry than normal.  You have poop that is harder or larger than normal. Get help right away if:  You have a fever.  You have belly pain that gets worse.  You feel sick to your stomach (nauseous).  You throw up (vomit).  Your hernia cannot be pushed in by very gently pressing on it when you are lying down. Do not try to force the bulge back in if it will not push in easily.  Your hernia: ? Changes in shape or size. ? Changes color. ? Feels hard or it hurts when you touch it. These symptoms may represent a serious problem that is an emergency. Do not   wait to see if the symptoms will go away. Get medical help right away. Call your local emergency services (911 in the [place] Summary  A hernia happens when tissue inside your body pushes out through a weak spot in the belly muscles. This creates a bulge.  If your hernia is small and it does not hurt, you may not need treatment. If your hernia is large or it hurts, you may need surgery.  If you will be having surgery, watch your hernia for changes in shape, size, or color. Tell your doctor about any changes. This information is not intended to replace advice given to you by your health care provider. Make sure you discuss any questions you have with  your health care provider. Document Revised: 10/09/2018 Document Reviewed: 03/20/2017 Elsevier Patient Education  2020 Elsevier Inc.  

## 2020-04-14 ENCOUNTER — Ambulatory Visit (INDEPENDENT_AMBULATORY_CARE_PROVIDER_SITE_OTHER): Payer: Medicaid Other | Admitting: General Surgery

## 2020-04-14 ENCOUNTER — Encounter: Payer: Self-pay | Admitting: General Surgery

## 2020-04-14 ENCOUNTER — Other Ambulatory Visit: Payer: Self-pay

## 2020-04-14 VITALS — BP 152/90 | HR 48 | Temp 98.3°F | Resp 16 | Ht 66.0 in | Wt 320.0 lb

## 2020-04-14 DIAGNOSIS — R1031 Right lower quadrant pain: Secondary | ICD-10-CM | POA: Diagnosis not present

## 2020-04-14 NOTE — Progress Notes (Signed)
ACEY WOODFIELD; [id number]; 1966-01-26   HPI Patient is a 54 year old black male who was referred to my care by Dr. Ronne Binning of urology for evaluation and treatment of a possible right inguinal hernia.  Patient states he saw Dr. Ronne Binning for prostate issues.  He did tell him that due to his abnormal gait due to left knee issues, he does have right-sided back pain that radiates around the right side towards the right groin region.  He has never noticed a new lump in the right groin.  It is not made worse with straining.  He primarily has the discomfort after walking some distance.  He denies any nausea or vomiting. Past Medical History:  Diagnosis Date  . Aneurysm of aorta (HCC)   . Arthritis   . Complication of anesthesia    hard to wake up after 2012 carpel tunnel, knee arthroscopy. did ok after 2018 knee surgery  . Gout   . Hypertension   . Sleep apnea     Past Surgical History:  Procedure Laterality Date  . CARPAL TUNNEL RELEASE Right   . COLONOSCOPY WITH PROPOFOL N/A 04/03/2018   Procedure: COLONOSCOPY WITH PROPOFOL;  Surgeon: Corbin Ade, MD;  [facility];  Service: Endoscopy;  Laterality: N/A;  1:00pm  . EYE SURGERY Left    d/t cornea laceration  . KNEE ARTHROSCOPY Left   . KNEE CLOSED REDUCTION Left 09/18/2016   Procedure: LEFT CLOSED MANIPULATION KNEE;  Surgeon: Valeria Batman, MD;  Location: [facility] OR;  Service: Orthopedics;  Laterality: Left;    Family History  Problem Relation Age of Onset  . Prostate cancer Paternal Uncle        multiple  . Colon polyps Father 82  . Heart disease Father   . Colon cancer Neg Hx     Current Outpatient Medications on File Prior to Visit  Medication Sig Dispense Refill  . allopurinol (ZYLOPRIM) 300 MG tablet Take 300 mg by mouth daily.    Marland Kitchen aspirin EC 81 MG tablet Take 81 mg by mouth daily.     . chlorthalidone (HYGROTON) 25 MG tablet Take 25 mg by mouth daily.     . cyclobenzaprine (FLEXERIL) 5 MG tablet Take 1 tablet  (5 mg total) by mouth 3 (three) times daily as needed for muscle spasms. 30 tablet 0  . diclofenac sodium (VOLTAREN) 1 % GEL Apply 1 application topically 3 (three) times daily as needed.    Marland Kitchen lisinopril (PRINIVIL,ZESTRIL) 40 MG tablet Take 40 mg by mouth daily.  0   No current facility-administered medications on file prior to visit.    Allergies  Allergen Reactions  . Bean Pod Extract Anaphylaxis    Throat swells,  Throat swells, SOB  . Other Anaphylaxis    All Beans    Social History   Substance and Sexual Activity  Alcohol Use No    Social History   Tobacco Use  Smoking Status Never Smoker  Smokeless Tobacco Never Used    Review of Systems  Constitutional: Negative.   HENT: Negative.   Eyes: Positive for blurred vision.  Respiratory: Negative.   Cardiovascular: Negative.   Gastrointestinal: Negative.   Genitourinary: Positive for frequency.  Musculoskeletal: Positive for back pain and joint pain.  Skin: Negative.   Neurological: Negative.   Endo/Heme/Allergies: Negative.   Psychiatric/Behavioral: Negative.     Objective   Vitals:   04/14/20 1022  BP: (!) 152/90  Pulse: (!) 48  Resp: 16  Temp: 98.3 F (36.8  C)  SpO2: 93%    Physical Exam Vitals reviewed.  Constitutional:      Appearance: Normal appearance. He is obese. He is not ill-appearing.  HENT:     Head: Normocephalic and atraumatic.  Cardiovascular:     Rate and Rhythm: Normal rate and regular rhythm.     Heart sounds: Normal heart sounds. No murmur heard.  No friction rub. No gallop.   Pulmonary:     Effort: Pulmonary effort is normal. No respiratory distress.     Breath sounds: Normal breath sounds. No stridor. No wheezing, rhonchi or rales.  Abdominal:     General: There is no distension.     Palpations: Abdomen is soft. There is no mass.     Tenderness: There is no abdominal tenderness. There is no guarding or rebound.     Hernia: No hernia is present.     Comments: I could not  appreciate a right inguinal hernia.  No left inguinal hernia was identified.  Skin:    General: Skin is warm and dry.  Neurological:     Mental Status: He is alert and oriented to person, place, and time.   Dr. Dimas Millin notes reviewed  Assessment  Right groin pain secondary to musculoskeletal issues with his gait.  No inguinal hernias appreciated. Plan   Literature was given concerning inguinal hernias.  Patient is reassured that he does not have a hernia.  Follow-up as needed.

## 2020-04-14 NOTE — Patient Instructions (Signed)

## 2020-05-31 ENCOUNTER — Ambulatory Visit (INDEPENDENT_AMBULATORY_CARE_PROVIDER_SITE_OTHER): Payer: BLUE CROSS/BLUE SHIELD | Admitting: Urology

## 2020-05-31 ENCOUNTER — Encounter: Payer: Self-pay | Admitting: Urology

## 2020-05-31 VITALS — BP 124/80 | HR 74 | Temp 98.5°F | Ht 67.0 in | Wt 317.0 lb

## 2020-05-31 DIAGNOSIS — N401 Enlarged prostate with lower urinary tract symptoms: Secondary | ICD-10-CM | POA: Diagnosis not present

## 2020-05-31 DIAGNOSIS — N138 Other obstructive and reflux uropathy: Secondary | ICD-10-CM | POA: Diagnosis not present

## 2020-05-31 DIAGNOSIS — M549 Dorsalgia, unspecified: Secondary | ICD-10-CM

## 2020-05-31 LAB — URINALYSIS, ROUTINE W REFLEX MICROSCOPIC
Bilirubin, UA: NEGATIVE
Glucose, UA: NEGATIVE
Ketones, UA: NEGATIVE
Leukocytes,UA: NEGATIVE
Nitrite, UA: NEGATIVE
Protein,UA: NEGATIVE
RBC, UA: NEGATIVE
Specific Gravity, UA: 1.02 (ref 1.005–1.030)
Urobilinogen, Ur: 2 mg/dL — ABNORMAL HIGH (ref 0.2–1.0)
pH, UA: 8.5 — ABNORMAL HIGH (ref 5.0–7.5)

## 2020-05-31 NOTE — Progress Notes (Signed)
Urological Symptom Review  Patient is experiencing the following symptoms: Get up at night to urinate Leakage of urine   Review of Systems  Gastrointestinal (upper)  : Negative for upper GI symptoms  Gastrointestinal (lower) : Negative for lower GI symptoms  Constitutional : Negative for symptoms  Skin: Negative for skin symptoms  Eyes: Negative for eye symptoms  Ear/Nose/Throat : Negative for Ear/Nose/Throat symptoms  Hematologic/Lymphatic: Negative for Hematologic/Lymphatic symptoms  Cardiovascular : Negative for cardiovascular symptoms  Respiratory : Negative for respiratory symptoms  Endocrine: Negative for endocrine symptoms  Musculoskeletal: Joint pain  Back pain  Neurological: Negative for neurological symptoms  Psychologic: Negative for psychiatric symptoms

## 2020-05-31 NOTE — Progress Notes (Signed)
05/31/2020 2:25 PM   David [name] 23-Sep-1965 [id number]  Referring provider: Lovey Newcomer, PA [address] [place],  [place] [address]  followup BPH and back pain  HPI: David [name] is a 54yo here for followup for BPH and back pain. He saw Dr. Lovell Sheehan who did not find a right inguinal hernia.  He has moderate LUTS on no BPH therapy. He is not bothered by his LUTS. He continues to have right sided back pain that will radiate to his right groin. It is worse with ambulation   PMH: Past Medical History:  Diagnosis Date  . Aneurysm of aorta (HCC)   . Arthritis   . Complication of anesthesia    hard to wake up after 2012 carpel tunnel, knee arthroscopy. did ok after 2018 knee surgery  . Gout   . Hypertension   . Sleep apnea     Surgical History: Past Surgical History:  Procedure Laterality Date  . CARPAL TUNNEL RELEASE Right   . COLONOSCOPY WITH PROPOFOL N/A 04/03/2018   Procedure: COLONOSCOPY WITH PROPOFOL;  Surgeon: Corbin Ade, MD;  [facility];  Service: Endoscopy;  Laterality: N/A;  1:00pm  . EYE SURGERY Left    d/t cornea laceration  . KNEE ARTHROSCOPY Left   . KNEE CLOSED REDUCTION Left 09/18/2016   Procedure: LEFT CLOSED MANIPULATION KNEE;  Surgeon: Valeria Batman, MD;  Location: [facility] OR;  Service: Orthopedics;  Laterality: Left;    Home Medications:  Allergies as of 05/31/2020      Reactions   Bean Pod Extract Anaphylaxis   Throat swells,  Throat swells, SOB   Other Anaphylaxis   All Beans      Medication List       Accurate as of May 31, 2020  2:25 PM. If you have any questions, ask your nurse or doctor.        allopurinol 300 MG tablet Commonly known as: ZYLOPRIM Take 300 mg by mouth daily.   aspirin EC 81 MG tablet Take 81 mg by mouth daily.   chlorthalidone 25 MG tablet Commonly known as: HYGROTON Take 25 mg by mouth daily.   cyclobenzaprine 5 MG tablet Commonly known as: FLEXERIL Take 1 tablet (5 mg total) by  mouth 3 (three) times daily as needed for muscle spasms.   diclofenac sodium 1 % Gel Commonly known as: VOLTAREN Apply 1 application topically 3 (three) times daily as needed.   lisinopril 40 MG tablet Commonly known as: ZESTRIL Take 40 mg by mouth daily.       Allergies:  Allergies  Allergen Reactions  . Bean Pod Extract Anaphylaxis    Throat swells,  Throat swells, SOB  . Other Anaphylaxis    All Beans    Family History: Family History  Problem Relation Age of Onset  . Prostate cancer Paternal Uncle        multiple  . Colon polyps Father 58  . Heart disease Father   . Colon cancer Neg Hx     Social History:  reports that he has never smoked. He has never used smokeless tobacco. He reports that he does not drink alcohol and does not use drugs.  ROS: All other review of systems were reviewed and are negative except what is noted above in HPI  Physical Exam: BP 124/80   Pulse 74   Temp 98.5 F (36.9 C)   Ht 5\' 7"  (1.702 m)   Wt (!) 317 lb (143.8 kg)  BMI 49.65 kg/m   Constitutional:  Alert and oriented, No acute distress. HEENT: [place] AT, moist mucus membranes.  Trachea midline, no masses. Cardiovascular: No clubbing, cyanosis, or edema. Respiratory: Normal respiratory effort, no increased work of breathing. GI: Abdomen is soft, nontender, nondistended, no abdominal masses GU: No CVA tenderness.  Lymph: No cervical or inguinal lymphadenopathy. Skin: No rashes, bruises or suspicious lesions. Neurologic: Grossly intact, no focal deficits, moving all 4 extremities. Psychiatric: Normal mood and affect.  Laboratory Data: Lab Results  Component Value Date   WBC 8.7 04/01/2018   HGB 14.3 04/01/2018   HCT 43.3 04/01/2018   MCV 83.0 04/01/2018   PLT 192 04/01/2018    Lab Results  Component Value Date   CREATININE 1.40 (H) 03/03/2019    No results found for: PSA  No results found for: TESTOSTERONE  No results found for: HGBA1C  Urinalysis No results  found for: COLORURINE, APPEARANCEUR, LABSPEC, PHURINE, GLUCOSEU, HGBUR, BILIRUBINUR, KETONESUR, PROTEINUR, UROBILINOGEN, NITRITE, LEUKOCYTESUR  No results found for: LABMICR, WBCUA, RBCUA, LABEPIT, MUCUS, BACTERIA  Pertinent Imaging:  No results found for this or any previous visit.  No results found for this or any previous visit.  No results found for this or any previous visit.  No results found for this or any previous visit.  Results for orders placed during the hospital encounter of 02/02/08  US Renal  Narrative Clinical Data: Chronic renal disease.  RENAL/URINARY TRACT ULTRASOUND  Technique:  Complete ultrasound examination of the urinary tract was performed including evaluation of the kidneys renal collecting systems and urinary bladder.  Comparison: None  Findings: Right kidney measures 11.6 cm and left kidney, 11.3 cm. Parenchymal echo texture is uniform.  No hydronephrosis.  Bladder is unremarkable.  IMPRESSION: No acute findings.  Provider: Joesph Fillers  No results found for this or any previous visit.  No results found for this or any previous visit.  No results found for this or any previous visit.   Assessment & Plan:    1. Benign prostatic hyperplasia with urinary obstruction -continue observation since patient is not bothered by his LUTS.  - Urinalysis, Routine w reflex microscopic  2. Dorsalgia -flexeril prn, referral to Dr. Romeo Apple   No follow-ups on file.  Wilkie Aye, MD  [facility] [place]

## 2020-05-31 NOTE — Patient Instructions (Signed)

## 2020-06-16 ENCOUNTER — Telehealth: Payer: Self-pay | Admitting: Orthopedic Surgery

## 2020-06-16 NOTE — Telephone Encounter (Signed)
Thank you; just to clarify, you cannot take over his care but can provide a second opinion as noted.

## 2020-06-16 NOTE — Telephone Encounter (Signed)
I can take over his care I am happy to provide a second opinion and direct him to another orthopedist who does revisions

## 2020-06-16 NOTE — Telephone Encounter (Signed)
Patient has been referred by his urologist as well as primary care, Daria Pastures, PA, [facility], for 2nd opinion of knee status/post 2 surgeries - manipulation being the most recent, on 09/18/2016. Operative report/notes are in patient's chart. Patient does not wish to return to the orthopaedist who performed the surgeries. Please review and advise.

## 2020-06-17 NOTE — Telephone Encounter (Signed)
CORRECT AND MAKE SURE HE KNOWS THAT I M SEEING HIM 1 TIME

## 2020-06-20 ENCOUNTER — Encounter: Payer: Self-pay | Admitting: Orthopedic Surgery

## 2020-06-20 NOTE — Telephone Encounter (Signed)
Called back to patient; relayed. Patient agreed to be seen for the one-time second opinion visit. See referral notes in workqueue.

## 2020-07-06 ENCOUNTER — Ambulatory Visit: Payer: BLUE CROSS/BLUE SHIELD | Admitting: Orthopedic Surgery

## 2020-07-13 NOTE — Progress Notes (Deleted)
Cardiology Office Note  Date: 07/13/2020   ID: David [name], DOB 27-Oct-1965, MRN [id number]  PCP:  Lovey Newcomer, PA  Cardiologist:  Dina Rich, MD Electrophysiologist:  None   Chief Complaint: 1 year cardiac follow up  History of Present Illness: David [name] is a 55 y.o. male with a history of thoracic aortic aneurysm, HTN, sleep apnea  Last encounter with Dr. Wyline Mood 02/11/2019 via telemedicine.  CT scan to 2011 4.5 cm ascending aneurysm according to prior clinic notes.  Follow-up echo measured at 4.7 cm.  No recent symptoms.  Compliant with antihypertensive medications.  Plan was to repeat CTA and continue beta-blocker.  Blood pressure was at goal and continuing current medications.    Past Medical History:  Diagnosis Date  . Aneurysm of aorta (HCC)   . Arthritis   . Complication of anesthesia    hard to wake up after 2012 carpel tunnel, knee arthroscopy. did ok after 2018 knee surgery  . Gout   . Hypertension   . Sleep apnea     Past Surgical History:  Procedure Laterality Date  . CARPAL TUNNEL RELEASE Right   . COLONOSCOPY WITH PROPOFOL N/A 04/03/2018   Procedure: COLONOSCOPY WITH PROPOFOL;  Surgeon: Corbin Ade, MD;  [facility];  Service: Endoscopy;  Laterality: N/A;  1:00pm  . EYE SURGERY Left    d/t cornea laceration  . KNEE ARTHROSCOPY Left   . KNEE CLOSED REDUCTION Left 09/18/2016   Procedure: LEFT CLOSED MANIPULATION KNEE;  Surgeon: Valeria Batman, MD;  Location: [facility] OR;  Service: Orthopedics;  Laterality: Left;    Current Outpatient Medications  Medication Sig Dispense Refill  . allopurinol (ZYLOPRIM) 300 MG tablet Take 300 mg by mouth daily.    Marland Kitchen aspirin EC 81 MG tablet Take 81 mg by mouth daily.     . chlorthalidone (HYGROTON) 25 MG tablet Take 25 mg by mouth daily.     . cyclobenzaprine (FLEXERIL) 5 MG tablet Take 1 tablet (5 mg total) by mouth 3 (three) times daily as needed for muscle spasms. 30 tablet 0  . diclofenac  sodium (VOLTAREN) 1 % GEL Apply 1 application topically 3 (three) times daily as needed.    Marland Kitchen lisinopril (PRINIVIL,ZESTRIL) 40 MG tablet Take 40 mg by mouth daily.  0   No current facility-administered medications for this visit.   Allergies:  Bean pod extract and Other   Social History: The patient  reports that he has never smoked. He has never used smokeless tobacco. He reports that he does not drink alcohol and does not use drugs.   Family History: The patient's family history includes Colon polyps (age of onset: 4) in his father; Heart disease in his father; Prostate cancer in his paternal uncle.   ROS:  Please see the history of present illness. Otherwise, complete review of systems is positive for {NONE DEFAULTED:18576::"none"}.  All other systems are reviewed and negative.   Physical Exam: VS:  There were no vitals taken for this visit., BMI There is no height or weight on file to calculate BMI.  Wt Readings from Last 3 Encounters:  05/31/20 (!) 317 lb (143.8 kg)  04/14/20 (!) 320 lb (145.2 kg)  03/31/20 (!) 320 lb (145.2 kg)    General: Patient appears comfortable at rest. HEENT: Conjunctiva and lids normal, oropharynx clear with moist mucosa. Neck: Supple, no elevated JVP or carotid bruits, no thyromegaly. Lungs: Clear to auscultation, nonlabored breathing at rest. Cardiac: Regular rate  and rhythm, no S3 or significant systolic murmur, no pericardial rub. Abdomen: Soft, nontender, no hepatomegaly, bowel sounds present, no guarding or rebound. Extremities: No pitting edema, distal pulses 2+. Skin: Warm and dry. Musculoskeletal: No kyphosis. Neuropsychiatric: Alert and oriented x3, affect grossly appropriate.  ECG:  {EKG/Telemetry Strips Reviewed:[phone]}  Recent Labwork: No results found for requested labs within last 8760 hours.  No results found for: CHOL, TRIG, HDL, CHOLHDL, VLDL, LDLCALC, LDLDIRECT  Other Studies Reviewed Today:  CTA Chest Aorta  03/03/2019 IMPRESSION: 1. Aneurysmal dilatation ascending aorta measures 43 mm slightly increased from 42 mm on CT 2016. Recommend annual imaging followup  Assessment and Plan:  1. Thoracic aortic aneurysm without rupture (HCC)   2. Essential hypertension    1. Thoracic aortic aneurysm without rupture (HCC) ***  2. Essential hypertension ***  Medication Adjustments/Labs and Tests Ordered: Current medicines are reviewed at length with the patient today.  Concerns regarding medicines are outlined above.   Disposition: Follow-up with ***  Signed, Rennis Harding, NP 07/13/2020 7:30 PM    [facility] [address] [place], [place], [place] [address] Phone: [phone]; Fax: [phone]

## 2020-07-14 ENCOUNTER — Ambulatory Visit: Payer: Medicaid Other | Admitting: Family Medicine

## 2020-07-14 DIAGNOSIS — I712 Thoracic aortic aneurysm, without rupture: Secondary | ICD-10-CM

## 2020-07-14 DIAGNOSIS — I10 Essential (primary) hypertension: Secondary | ICD-10-CM

## 2021-05-29 ENCOUNTER — Encounter: Payer: Self-pay | Admitting: Urology

## 2021-05-29 ENCOUNTER — Other Ambulatory Visit: Payer: Self-pay

## 2021-05-29 ENCOUNTER — Ambulatory Visit (INDEPENDENT_AMBULATORY_CARE_PROVIDER_SITE_OTHER): Payer: Medicare Other | Admitting: Urology

## 2021-05-29 VITALS — BP 140/83 | HR 75

## 2021-05-29 DIAGNOSIS — N401 Enlarged prostate with lower urinary tract symptoms: Secondary | ICD-10-CM | POA: Diagnosis not present

## 2021-05-29 DIAGNOSIS — M549 Dorsalgia, unspecified: Secondary | ICD-10-CM

## 2021-05-29 DIAGNOSIS — N138 Other obstructive and reflux uropathy: Secondary | ICD-10-CM

## 2021-05-29 DIAGNOSIS — R351 Nocturia: Secondary | ICD-10-CM | POA: Diagnosis not present

## 2021-05-29 LAB — URINALYSIS, ROUTINE W REFLEX MICROSCOPIC
Bilirubin, UA: NEGATIVE
Glucose, UA: NEGATIVE
Ketones, UA: NEGATIVE
Leukocytes,UA: NEGATIVE
Nitrite, UA: NEGATIVE
Protein,UA: NEGATIVE
RBC, UA: NEGATIVE
Specific Gravity, UA: 1.02 (ref 1.005–1.030)
Urobilinogen, Ur: 0.2 mg/dL (ref 0.2–1.0)
pH, UA: 7 (ref 5.0–7.5)

## 2021-05-29 MED ORDER — TADALAFIL 20 MG PO TABS: 20.0000 mg | ORAL_TABLET | Freq: Every day | ORAL | 5 refills | Status: AC | PRN

## 2021-05-29 NOTE — Progress Notes (Signed)
Urological Symptom Review  Patient is experiencing the following symptoms: Get up at night to urinate Leakage of urine   Review of Systems  Gastrointestinal (upper)  : Negative for upper GI symptoms  Gastrointestinal (lower) : Negative for lower GI symptoms  Constitutional : Negative for symptoms  Skin: Negative for skin symptoms  Eyes: Negative for eye symptoms  Ear/Nose/Throat : Negative for Ear/Nose/Throat symptoms  Hematologic/Lymphatic: Negative for Hematologic/Lymphatic symptoms  Cardiovascular : Negative for cardiovascular symptoms  Respiratory : Negative for respiratory symptoms  Endocrine: Negative for endocrine symptoms  Musculoskeletal: Back pain  Neurological: Negative for neurological symptoms  Psychologic: Negative for psychiatric symptoms

## 2021-05-29 NOTE — Progress Notes (Signed)
05/29/2021 11:00 AM   David [name] 11-10-1965 [id number]  Referring provider: Lovey Newcomer, PA [address] [place],  [place] [address]  Followup BPH   HPI: David [name] is a 55yo here for followup for BPh with nocturia. IPSS 3 QOL 1. Nocturia 2x. He has post void dribbling. He is starting a weight loss program due to multiple orthopeadic issues. For the past 6 months he has noted difficulty maintaining his erection. He gets a firm erection but it only last 5-10 minutes. Good libido. Good exercise. NO other complaints today.    PMH: Past Medical History:  Diagnosis Date   Aneurysm of aorta (HCC)    Arthritis    Complication of anesthesia    hard to wake up after 2012 carpel tunnel, knee arthroscopy. did ok after 2018 knee surgery   Gout    Hypertension    Sleep apnea     Surgical History: Past Surgical History:  Procedure Laterality Date   CARPAL TUNNEL RELEASE Right    COLONOSCOPY WITH PROPOFOL N/A 04/03/2018   Procedure: COLONOSCOPY WITH PROPOFOL;  Surgeon: Corbin Ade, MD;  [facility]: [facility];  Service: Endoscopy;  Laterality: N/A;  1:00pm   EYE SURGERY Left    d/t cornea laceration   KNEE ARTHROSCOPY Left    KNEE CLOSED REDUCTION Left 09/18/2016   Procedure: LEFT CLOSED MANIPULATION KNEE;  Surgeon: Valeria Batman, MD;  Location: [facility] OR;  Service: Orthopedics;  Laterality: Left;    Home Medications:  Allergies as of 05/29/2021       Reactions   Bean Pod Extract Anaphylaxis   Throat swells,  Throat swells, SOB   Other Anaphylaxis   All Beans        Medication List        Accurate as of May 29, 2021 11:00 AM. If you have any questions, ask your nurse or doctor.          allopurinol 300 MG tablet Commonly known as: ZYLOPRIM Take 300 mg by mouth daily.   aspirin EC 81 MG tablet Take 81 mg by mouth daily.   chlorthalidone 25 MG tablet Commonly known as: HYGROTON Take 25 mg by mouth daily.   cyclobenzaprine 5 MG  tablet Commonly known as: FLEXERIL Take 1 tablet (5 mg total) by mouth 3 (three) times daily as needed for muscle spasms.   diclofenac sodium 1 % Gel Commonly known as: VOLTAREN Apply 1 application topically 3 (three) times daily as needed.   lisinopril 40 MG tablet Commonly known as: ZESTRIL Take 40 mg by mouth daily.        Allergies:  Allergies  Allergen Reactions   Bean Pod Extract Anaphylaxis    Throat swells,  Throat swells, SOB   Other Anaphylaxis    All Beans    Family History: Family History  Problem Relation Age of Onset   Prostate cancer Paternal Uncle        multiple   Colon polyps Father 39   Heart disease Father    Colon cancer Neg Hx     Social History:  reports that he has never smoked. He has never used smokeless tobacco. He reports that he does not drink alcohol and does not use drugs.  ROS: All other review of systems were reviewed and are negative except what is noted above in HPI  Physical Exam: BP 140/83   Pulse 75   Constitutional:  Alert and oriented, No acute distress. HEENT: [place] AT, moist  mucus membranes.  Trachea midline, no masses. Cardiovascular: No clubbing, cyanosis, or edema. Respiratory: Normal respiratory effort, no increased work of breathing. GI: Abdomen is soft, nontender, nondistended, no abdominal masses GU: No CVA tenderness.  Lymph: No cervical or inguinal lymphadenopathy. Skin: No rashes, bruises or suspicious lesions. Neurologic: Grossly intact, no focal deficits, moving all 4 extremities. Psychiatric: Normal mood and affect.  Laboratory Data: Lab Results  Component Value Date   WBC 8.7 04/01/2018   HGB 14.3 04/01/2018   HCT 43.3 04/01/2018   MCV 83.0 04/01/2018   PLT 192 04/01/2018    Lab Results  Component Value Date   CREATININE 1.40 (H) 03/03/2019    No results found for: PSA  No results found for: TESTOSTERONE  No results found for: HGBA1C  Urinalysis    Component Value Date/Time    APPEARANCEUR Clear 05/31/2020 1425   GLUCOSEU Negative 05/31/2020 1425   BILIRUBINUR Negative 05/31/2020 1425   PROTEINUR Negative 05/31/2020 1425   NITRITE Negative 05/31/2020 1425   LEUKOCYTESUR Negative 05/31/2020 1425    Lab Results  Component Value Date   LABMICR Comment 05/31/2020    Pertinent Imaging:  No results found for this or any previous visit.  No results found for this or any previous visit.  No results found for this or any previous visit.  No results found for this or any previous visit.  Results for orders placed during the hospital encounter of 02/02/08  US Renal  Narrative Clinical Data: Chronic renal disease.  RENAL/URINARY TRACT ULTRASOUND  Technique:  Complete ultrasound examination of the urinary tract was performed including evaluation of the kidneys renal collecting systems and urinary bladder.  Comparison: None  Findings: Right kidney measures 11.6 cm and left kidney, 11.3 cm. Parenchymal echo texture is uniform.  No hydronephrosis.  Bladder is unremarkable.  IMPRESSION: No acute findings.  Provider: Joesph Fillers  No results found for this or any previous visit.  No results found for this or any previous visit.  No results found for this or any previous visit.   Assessment & Plan:    1. Benign prostatic hyperplasia with urinary obstruction -RTC 1 year - Urinalysis, Routine w reflex microscopic  2. Nocturia -Decrease fluid intake within 2 hours of going to bed  3. Erectile dysfunction -Tadalafil 20mg  prn   No follow-ups on file.  , MD  [facility] [place]

## 2021-05-29 NOTE — Patient Instructions (Signed)
Erectile Dysfunction °Erectile dysfunction (ED) is the inability to get or keep an erection in order to have sexual intercourse. ED is considered a symptom of an underlying disorder and is not considered a disease. ED may include: °Inability to get an erection. °Lack of enough hardness of the erection to allow penetration. °Loss of erection before sex is finished. °What are the causes? °This condition may be caused by: °Physical causes, such as: °Artery problems. This may include heart disease, high blood pressure, atherosclerosis, and diabetes. °Hormonal problems, such as low testosterone. °Obesity. °Nerve problems. This may include back or pelvic injuries, multiple sclerosis, Parkinson's disease, spinal cord injury, and stroke. °Certain medicines, such as: °Pain relievers. °Antidepressants. °Blood pressure medicines and water pills (diuretics). °Cancer medicines. °Antihistamines. °Muscle relaxants. °Lifestyle factors, such as: °Use of drugs such as marijuana, cocaine, or opioids. °Excessive use of alcohol. °Smoking. °Lack of physical activity or exercise. °Psychological causes, such as: °Anxiety or stress. °Sadness or depression. °Exhaustion. °Fear about sexual performance. °Guilt. °What are the signs or symptoms? °Symptoms of this condition include: °Inability to get an erection. °Lack of enough hardness of the erection to allow penetration. °Loss of the erection before sex is finished. °Sometimes having normal erections, but with frequent unsatisfactory episodes. °Low sexual satisfaction in either partner due to erection problems. °A curved penis occurring with erection. The curve may cause pain, or the penis may be too curved to allow for intercourse. °Never having nighttime or morning erections. °How is this diagnosed? °This condition is often diagnosed by: °Performing a physical exam to find other diseases or specific problems with the penis. °Asking you detailed questions about the problem. °Doing tests,  such as: °Blood tests to check for diabetes mellitus or high cholesterol, or to measure hormone levels. °Other tests to check for underlying health conditions. °An ultrasound exam to check for scarring. °A test to check blood flow to the penis. °Doing a sleep study at home to measure nighttime erections. °How is this treated? °This condition may be treated by: °Medicines, such as: °Medicine taken by mouth to help you achieve an erection (oral medicine). °Hormone replacement therapy to replace low testosterone levels. °Medicine that is injected into the penis. Your health care provider may instruct you how to give yourself these injections at home. °Medicine that is delivered with a short applicator tube. The tube is inserted into the opening at the tip of the penis, which is the opening of the urethra. A tiny pellet of medicine is put in the urethra. The pellet dissolves and enhances erectile function. This is also called MUSE (medicated urethral system for erections) therapy. °Vacuum pump. This is a pump with a ring on it. The pump and ring are placed on the penis and used to create pressure that helps the penis become erect. °Penile implant surgery. In this procedure, you may receive: °An inflatable implant. This consists of cylinders, a pump, and a reservoir. The cylinders can be inflated with a fluid that helps to create an erection, and they can be deflated after intercourse. °A semi-rigid implant. This consists of two silicone rubber rods. The rods provide some rigidity. They are also flexible, so the penis can both curve downward in its normal position and become straight for sexual intercourse. °Blood vessel surgery to improve blood flow to the penis. During this procedure, a blood vessel from a different part of the body is placed into the penis to allow blood to flow around (bypass) damaged or blocked blood vessels. °Lifestyle changes,   such as exercising more, losing weight, and quitting smoking. °Follow  these instructions at home: °Medicines ° °Take over-the-counter and prescription medicines only as told by your health care provider. Do not increase the dosage without first discussing it with your health care provider. °If you are using self-injections, do injections as directed by your health care provider. Make sure you avoid any veins that are on the surface of the penis. After giving an injection, apply pressure to the injection site for 5 minutes. °Talk to your health care provider about how to prevent headaches while taking ED medicines. These medicines may cause a sudden headache due to the increase in blood flow in your body. °General instructions °Exercise regularly, as directed by your health care provider. Work with your health care provider to lose weight, if needed. °Do not use any products that contain nicotine or tobacco. These products include cigarettes, chewing tobacco, and vaping devices, such as e-cigarettes. If you need help quitting, ask your health care provider. °Before using a vacuum pump, read the instructions that come with the pump and discuss any questions with your health care provider. °Keep all follow-up visits. This is important. °Contact a health care provider if: °You feel nauseous. °You are vomiting. °You get sudden headaches while taking ED medicines. °You have any concerns about your sexual health. °Get help right away if: °You are taking oral or injectable medicines and you have an erection that lasts longer than 4 hours. If your health care provider is unavailable, go to the nearest emergency room for evaluation. An erection that lasts much longer than 4 hours can result in permanent damage to your penis. °You have severe pain in your groin or abdomen. °You develop redness or severe swelling of your penis. °You have redness spreading at your groin or lower abdomen. °You are unable to urinate. °You experience chest pain or a rapid heartbeat (palpitations) after taking oral  medicines. °These symptoms may represent a serious problem that is an emergency. Do not wait to see if the symptoms will go away. Get medical help right away. Call your local emergency services (911 in the [place] Do not drive yourself to the hospital. °Summary °Erectile dysfunction (ED) is the inability to get or keep an erection during sexual intercourse. °This condition is diagnosed based on a physical exam, your symptoms, and tests to determine the cause. Treatment varies depending on the cause and may include medicines, hormone therapy, surgery, or a vacuum pump. °You may need follow-up visits to make sure that you are using your medicines or devices correctly. °Get help right away if you are taking or injecting medicines and you have an erection that lasts longer than 4 hours. °This information is not intended to replace advice given to you by your health care provider. Make sure you discuss any questions you have with your health care provider. °Document Revised: 09/14/2020 Document Reviewed: 09/14/2020 °Elsevier Patient Education © 2022 [employer] ° °

## 2022-05-28 ENCOUNTER — Ambulatory Visit: Payer: Medicare Other | Admitting: Urology

## 2022-05-28 DIAGNOSIS — N138 Other obstructive and reflux uropathy: Secondary | ICD-10-CM

## 2023-01-15 ENCOUNTER — Telehealth: Payer: Self-pay | Admitting: *Deleted

## 2023-01-15 ENCOUNTER — Encounter: Payer: Self-pay | Admitting: *Deleted

## 2023-01-15 NOTE — Telephone Encounter (Signed)
 Noted  

## 2023-01-15 NOTE — Telephone Encounter (Signed)
Referral rec'd from PCP for screening TCS - patient isn't due until 04/2023 - letter sent to PCP advising them of this  Darl Pikes, referral is in [place]

## 2023-03-13 ENCOUNTER — Encounter: Payer: Self-pay | Admitting: *Deleted
